# Patient Record
Sex: Male | Born: 1980 | Race: White | Hispanic: No | Marital: Married | Smoking: Never smoker
Health system: Southern US, Community
[De-identification: ages and names within clinical notes are randomized; demographics above are authoritative.]

## PROBLEM LIST (undated history)

## (undated) DIAGNOSIS — I10 Essential (primary) hypertension: Secondary | ICD-10-CM

## (undated) HISTORY — DX: Essential (primary) hypertension: I10

## (undated) HISTORY — PX: CHOLECYSTECTOMY: SHX55

---

## 1999-07-22 ENCOUNTER — Emergency Department (HOSPITAL_COMMUNITY): Admission: EM | Admit: 1999-07-22 | Discharge: 1999-07-22 | Payer: Self-pay | Admitting: Emergency Medicine

## 1999-12-09 ENCOUNTER — Emergency Department (HOSPITAL_COMMUNITY): Admission: EM | Admit: 1999-12-09 | Discharge: 1999-12-09 | Payer: Self-pay | Admitting: Emergency Medicine

## 1999-12-09 ENCOUNTER — Encounter: Payer: Self-pay | Admitting: Emergency Medicine

## 2006-03-05 ENCOUNTER — Emergency Department (HOSPITAL_COMMUNITY): Admission: EM | Admit: 2006-03-05 | Discharge: 2006-03-06 | Payer: Self-pay | Admitting: Emergency Medicine

## 2006-03-07 ENCOUNTER — Ambulatory Visit (HOSPITAL_COMMUNITY): Admission: RE | Admit: 2006-03-07 | Discharge: 2006-03-07 | Payer: Self-pay | Admitting: Family Medicine

## 2007-12-16 ENCOUNTER — Emergency Department (HOSPITAL_COMMUNITY): Admission: EM | Admit: 2007-12-16 | Discharge: 2007-12-16 | Payer: Self-pay | Admitting: Emergency Medicine

## 2010-02-11 ENCOUNTER — Inpatient Hospital Stay (HOSPITAL_COMMUNITY): Admission: EM | Admit: 2010-02-11 | Discharge: 2010-02-14 | Payer: Self-pay | Admitting: Emergency Medicine

## 2010-02-13 ENCOUNTER — Encounter (INDEPENDENT_AMBULATORY_CARE_PROVIDER_SITE_OTHER): Payer: Self-pay | Admitting: General Surgery

## 2010-05-22 ENCOUNTER — Encounter: Payer: Self-pay | Admitting: Orthopedic Surgery

## 2010-07-12 LAB — HEPATIC FUNCTION PANEL
ALT: 14 U/L (ref 0–53)
ALT: 34 U/L (ref 0–53)
AST: 33 U/L (ref 0–37)
Alkaline Phosphatase: 59 U/L (ref 39–117)
Bilirubin, Direct: 0.2 mg/dL (ref 0.0–0.3)
Indirect Bilirubin: 0.3 mg/dL (ref 0.3–0.9)
Indirect Bilirubin: 0.6 mg/dL (ref 0.3–0.9)
Total Bilirubin: 0.8 mg/dL (ref 0.3–1.2)
Total Protein: 6.5 g/dL (ref 6.0–8.3)

## 2010-07-12 LAB — CBC
Hemoglobin: 15.4 g/dL (ref 13.0–17.0)
MCHC: 34.3 g/dL (ref 30.0–36.0)
Platelets: 200 10*3/uL (ref 150–400)
RBC: 5.18 MIL/uL (ref 4.22–5.81)
RDW: 12.4 % (ref 11.5–15.5)
WBC: 10.5 10*3/uL (ref 4.0–10.5)
WBC: 8.2 10*3/uL (ref 4.0–10.5)

## 2010-07-12 LAB — BASIC METABOLIC PANEL
BUN: 4 mg/dL — ABNORMAL LOW (ref 6–23)
Creatinine, Ser: 0.94 mg/dL (ref 0.4–1.5)
GFR calc non Af Amer: >60 mL/min (ref >60)

## 2010-07-12 LAB — COMPREHENSIVE METABOLIC PANEL
ALT: 17 U/L (ref 0–53)
AST: 20 U/L (ref 0–37)
CO2: 22 mEq/L (ref 19–32)
Calcium: 9.5 mg/dL (ref 8.4–10.5)
GFR calc Af Amer: >60 mL/min (ref >60)
GFR calc non Af Amer: >60 mL/min (ref >60)
Sodium: 134 mEq/L — ABNORMAL LOW (ref 135–145)

## 2010-07-12 LAB — DIFFERENTIAL
Basophils Absolute: 0 10*3/uL (ref 0.0–0.1)
Basophils Relative: 0 % (ref 0–1)
Eosinophils Absolute: 0 10*3/uL (ref 0.0–0.7)
Eosinophils Relative: 0 % (ref 0–5)
Eosinophils Relative: 1 % (ref 0–5)
Lymphocytes Relative: 16 % (ref 12–46)
Lymphs Abs: 1.3 10*3/uL (ref 0.7–4.0)
Monocytes Relative: 5 % (ref 3–12)
Neutro Abs: 6.1 10*3/uL (ref 1.7–7.7)

## 2010-07-12 LAB — LIPASE, BLOOD: Lipase: 26 U/L (ref 11–59)

## 2012-05-30 ENCOUNTER — Emergency Department (HOSPITAL_COMMUNITY)
Admission: EM | Admit: 2012-05-30 | Discharge: 2012-05-30 | Disposition: A | Discharge status: Home / Self Care | Payer: BC Managed Care – PPO | Attending: Emergency Medicine | Admitting: Emergency Medicine

## 2012-05-30 ENCOUNTER — Encounter (HOSPITAL_COMMUNITY): Payer: Self-pay | Admitting: *Deleted

## 2012-05-30 DIAGNOSIS — R509 Fever, unspecified: Secondary | ICD-10-CM | POA: Insufficient documentation

## 2012-05-30 DIAGNOSIS — R51 Headache: Secondary | ICD-10-CM | POA: Insufficient documentation

## 2012-05-30 DIAGNOSIS — J111 Influenza due to unidentified influenza virus with other respiratory manifestations: Secondary | ICD-10-CM

## 2012-05-30 DIAGNOSIS — IMO0001 Reserved for inherently not codable concepts without codable children: Secondary | ICD-10-CM | POA: Insufficient documentation

## 2012-05-30 DIAGNOSIS — R5381 Other malaise: Secondary | ICD-10-CM | POA: Insufficient documentation

## 2012-05-30 DIAGNOSIS — R059 Cough, unspecified: Secondary | ICD-10-CM | POA: Insufficient documentation

## 2012-05-30 DIAGNOSIS — R05 Cough: Secondary | ICD-10-CM | POA: Insufficient documentation

## 2012-05-30 DIAGNOSIS — J3489 Other specified disorders of nose and nasal sinuses: Secondary | ICD-10-CM | POA: Insufficient documentation

## 2012-05-30 MED ORDER — IBUPROFEN 800 MG PO TABS
800.0000 mg | ORAL_TABLET | Freq: Once | ORAL | Status: AC
  Administered 2012-05-30: 800 mg via ORAL
  Filled 2012-05-30: qty 1

## 2012-05-30 MED ORDER — HYDROCOD POLST-CHLORPHEN POLST 10-8 MG/5ML PO LQCR
5.0000 mL | Freq: Once | ORAL | Status: AC
  Administered 2012-05-30: 5 mL via ORAL
  Filled 2012-05-30: qty 5

## 2012-05-30 MED ORDER — HYDROCOD POLST-CHLORPHEN POLST 10-8 MG/5ML PO LQCR: 5.0000 mL | Freq: Two times a day (BID) | ORAL | Status: DC | PRN

## 2012-05-30 NOTE — ED Notes (Signed)
Fever, chill, nausea, cough, body aches. Runny nose. Sl sore throat.

## 2012-05-30 NOTE — ED Provider Notes (Signed)
History     CSN: [id number]  Arrival date & time 05/30/12  2144   First MD Initiated Contact with Patient 05/30/12 2156      Chief Complaint  Patient presents with  . Fever    (Consider location/radiation/quality/duration/timing/severity/associated sxs/prior treatment) Patient is a 32 y.o. male presenting with flu symptoms.  Influenza This is a new problem. The current episode started yesterday. The problem occurs constantly. The problem has been gradually worsening. Associated symptoms include chills, coughing, fatigue, a fever, headaches and myalgias. Pertinent negatives include no abdominal pain, arthralgias, change in bowel habit, chest pain, neck pain or rash. Nothing aggravates the symptoms. He has tried acetaminophen for the symptoms. The treatment provided no relief.    History reviewed. No pertinent past medical history.  Past Surgical History  Procedure Date  . Cholecystectomy     History reviewed. No pertinent family history.  History  Substance Use Topics  . Smoking status: Never Smoker   . Smokeless tobacco: Not on file  . Alcohol Use: Yes      Review of Systems  Constitutional: Positive for fever, chills and fatigue. Negative for activity change.       All ROS Neg except as noted in HPI  HENT: Negative for nosebleeds and neck pain.   Eyes: Negative for photophobia and discharge.  Respiratory: Positive for cough. Negative for shortness of breath and wheezing.   Cardiovascular: Negative for chest pain and palpitations.  Gastrointestinal: Negative for abdominal pain, blood in stool and change in bowel habit.  Genitourinary: Negative for dysuria, frequency and hematuria.  Musculoskeletal: Positive for myalgias. Negative for back pain and arthralgias.  Skin: Negative.  Negative for rash.  Neurological: Positive for headaches. Negative for dizziness, seizures and speech difficulty.  Psychiatric/Behavioral: Negative for hallucinations and confusion.     Allergies  Review of patient's allergies indicates no known allergies.  Home Medications  No current outpatient prescriptions on file.  BP 163/103  Pulse 109  Temp 100.5 F (38.1 C) (Oral)  Resp 20  Ht 6' (1.829 m)  Wt 287 lb (130.182 kg)  BMI 38.92 kg/m2  SpO2 98%  Physical Exam  Nursing note and vitals reviewed. Constitutional: He is oriented to person, place, and time. He appears well-developed and well-nourished.  Non-toxic appearance.  HENT:  Head: Normocephalic.  Right Ear: Tympanic membrane and external ear normal.  Left Ear: Tympanic membrane and external ear normal.       Nasal congestion.  Eyes: EOM and lids are normal. Pupils are equal, round, and reactive to light.  Neck: Normal range of motion. Neck supple. Carotid bruit is not present.  Cardiovascular: Normal rate, regular rhythm, normal heart sounds, intact distal pulses and normal pulses.   Pulmonary/Chest: Breath sounds normal. No respiratory distress.  Abdominal: Soft. Bowel sounds are normal. There is no tenderness. There is no guarding.  Musculoskeletal: Normal range of motion.  Lymphadenopathy:       Head (right side): No submandibular adenopathy present.       Head (left side): No submandibular adenopathy present.    He has no cervical adenopathy.  Neurological: He is alert and oriented to person, place, and time. He has normal strength. No cranial nerve deficit or sensory deficit. He exhibits normal muscle tone. Coordination normal.  Skin: Skin is warm and dry. No rash noted.  Psychiatric: He has a normal mood and affect. His speech is normal.    ED Course  Procedures (including critical care time)   Labs Reviewed  RAPID STREP SCREEN   No results found.   No diagnosis found.    MDM  I have reviewed nursing notes, vital signs, and all appropriate lab and imaging results for this patient. Examination and history are consistent with influenza. Patient is advised to use Tylenol every 4  hours or ibuprofen every 6 hours for fever and aching. He is given prescription for Tussionex every 12 hours for cough and congestion. He's been further advised to increase fluids, wash hands frequently, use mask until symptoms have resolved. Patient invited to return to the emergency department if any changes, problems, or concerns.       Kathie Dike, [place] 05/30/12 2308

## 2012-06-01 NOTE — ED Provider Notes (Signed)
Medical screening examination/treatment/procedure(s) were performed by non-physician practitioner and as supervising physician I was immediately available for consultation/collaboration.   Carleene Cooper III, MD 06/01/12 [phone]

## 2014-02-18 IMAGING — CR DG ANKLE 2V *L*
1 series · 1 of 1 positions shown · non-contrast
Comparison: None.

CLINICAL DATA: Injury at work 07/01/2012, ankle pain

LEFT ANKLE - 2 VIEW

[AP]
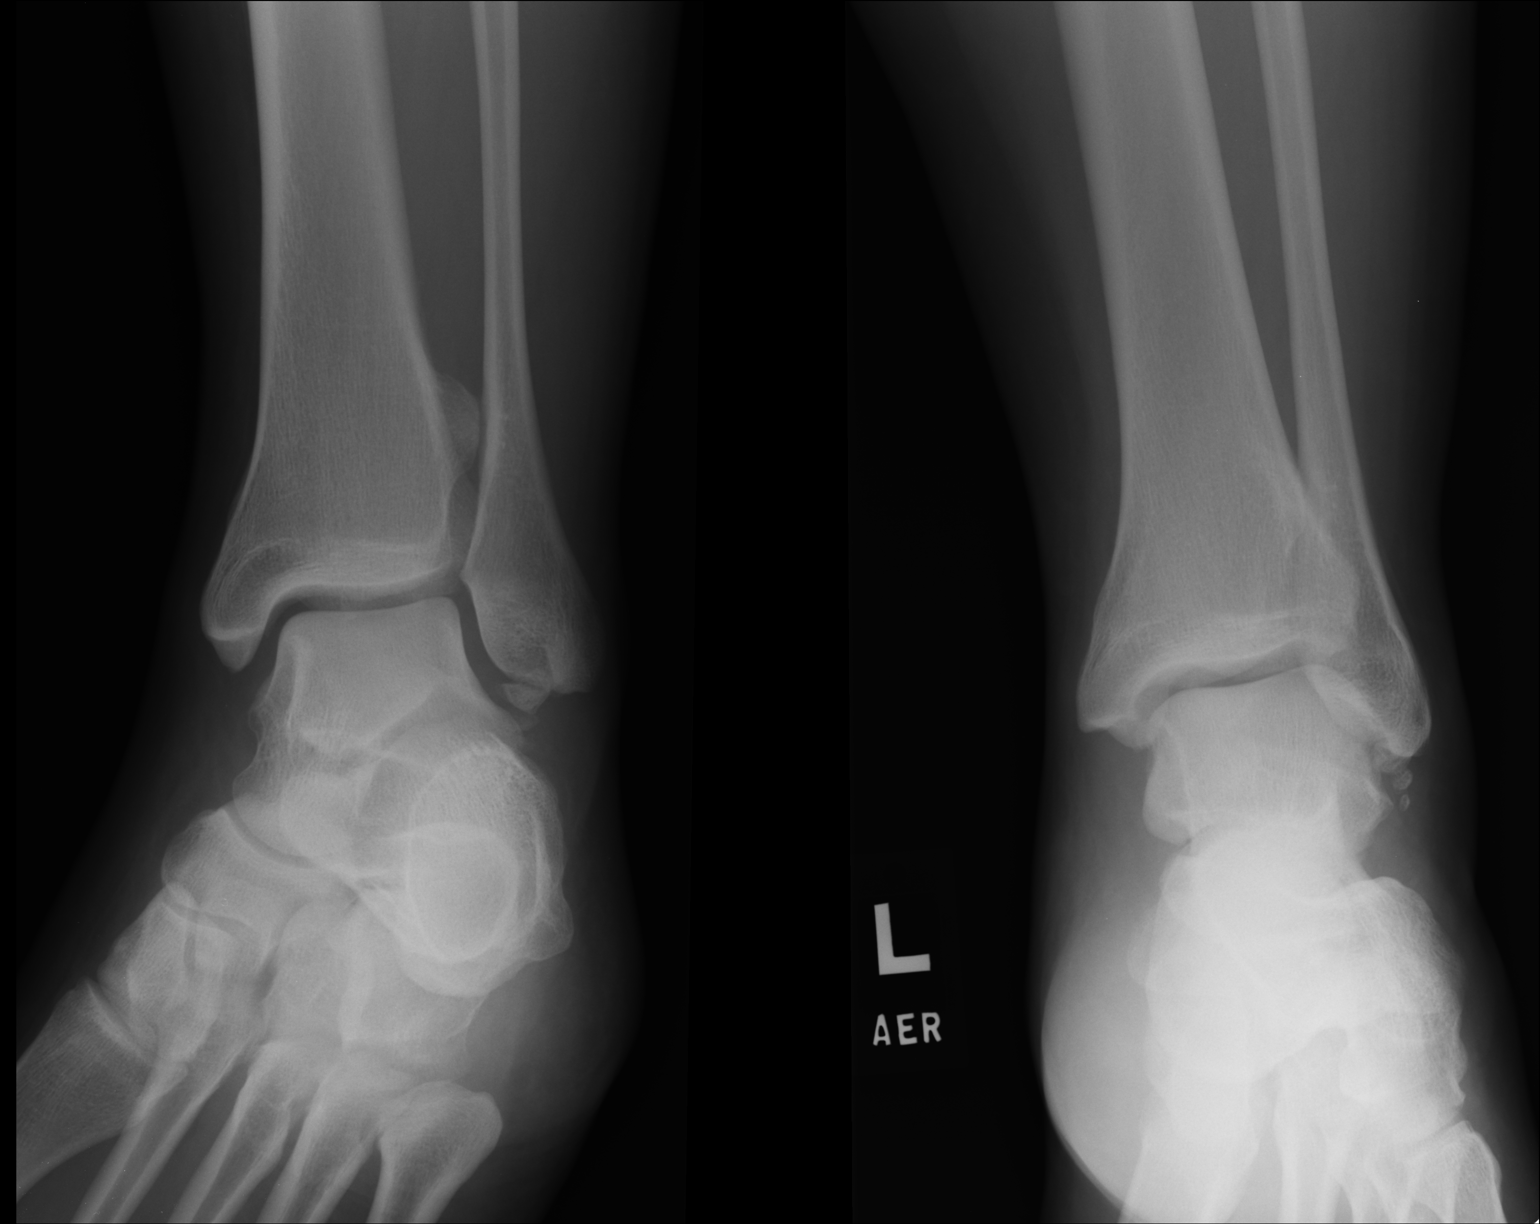

[1 of 1 positions shown; findings below may reference images not displayed]

FINDINGS: Two views of the left ankle submitted.  No acute fracture
or subluxation.  Ankle mortise is preserved.  Well corticated bony
fragment adjacent to tip of distal fibula is most likely from prior
injury.
IMPRESSION: No acute fracture or subluxation.  Ankle mortise is preserved.

## 2015-07-17 ENCOUNTER — Encounter (HOSPITAL_COMMUNITY): Payer: Self-pay

## 2015-07-17 ENCOUNTER — Emergency Department (HOSPITAL_COMMUNITY)
Admission: EM | Admit: 2015-07-17 | Discharge: 2015-07-17 | Disposition: A | Discharge status: Home / Self Care | Payer: Managed Care, Other (non HMO) | Attending: Emergency Medicine | Admitting: Emergency Medicine

## 2015-07-17 DIAGNOSIS — J111 Influenza due to unidentified influenza virus with other respiratory manifestations: Secondary | ICD-10-CM | POA: Insufficient documentation

## 2015-07-17 DIAGNOSIS — R69 Illness, unspecified: Secondary | ICD-10-CM

## 2015-07-17 DIAGNOSIS — R05 Cough: Secondary | ICD-10-CM | POA: Diagnosis present

## 2015-07-17 MED ORDER — BENZONATATE 100 MG PO CAPS: 100.0000 mg | ORAL_CAPSULE | Freq: Three times a day (TID) | ORAL | Status: DC

## 2015-07-17 MED ORDER — METHYLPREDNISOLONE 4 MG PO TBPK: ORAL_TABLET | ORAL | Status: DC

## 2015-07-17 MED ORDER — IBUPROFEN 800 MG PO TABS: 800.0000 mg | ORAL_TABLET | Freq: Three times a day (TID) | ORAL | Status: DC

## 2015-07-17 NOTE — Discharge Instructions (Signed)

## 2015-07-17 NOTE — ED Provider Notes (Signed)
CSN: [id number]     Arrival date & time 07/17/15  1008 History  By signing my name below, I, Ronney LionSuzanne Le, attest that this documentation has been prepared under the direction and in the presence of Eber HongBrian Brystal Kildow, MD. Electronically Signed: Ronney LionSuzanne Le, ED Scribe. 07/17/2015. 10:31 AM.    Chief Complaint  Patient presents with  . Cough   The history is provided by the patient. No language interpreter was used.    HPI Comments: Benjamin Farley is a 35 y.o. male who presents to the Emergency Department with multiple URI-type complaints, including cough and fever that began 2 days ago, and acutely worsened yesterday. He notes associated congestion, chills, generalized myalgias, rhinorrhea, and generalized headache secondary to his cough. Patient has not tried any treatments or medications for his symptoms. Patient states he is otherwise healthy, with no history of chronic medical conditions. He denies sore throat, abdominal pain, or leg swelling. He denies a history of smoking.    History reviewed. No pertinent past medical history. Past Surgical History  Procedure Laterality Date  . Cholecystectomy     No family history on file. Social History  Substance Use Topics  . Smoking status: Never Smoker   . Smokeless tobacco: None  . Alcohol Use: Yes     Comment: occ    Review of Systems  Constitutional: Positive for fever and chills.  HENT: Positive for congestion and rhinorrhea. Negative for sore throat.   Respiratory: Positive for cough.   Cardiovascular: Negative for leg swelling.  Gastrointestinal: Negative for abdominal pain.  Musculoskeletal: Positive for myalgias (generalized).  Neurological: Positive for headaches.  All other systems reviewed and are negative.  Allergies  Review of patient's allergies indicates no known allergies.  Home Medications   Prior to Admission medications   Medication Sig Start Date End Date Taking? Authorizing Provider  benzonatate (TESSALON) 100 MG  capsule Take 1 capsule (100 mg total) by mouth every 8 (eight) hours. 07/17/15   Eber HongBrian Rithvik Orcutt, MD  chlorpheniramine-HYDROcodone [facility](TUSSIONEX PENNKINETIC ER) 10-8 MG/5ML LQCR Take 5 mLs by mouth every 12 (twelve) hours as needed. 05/30/12   Ivery QualeHobson Bryant, PA-C  ibuprofen (ADVIL,MOTRIN) 800 MG tablet Take 1 tablet (800 mg total) by mouth 3 (three) times daily. 07/17/15   Eber HongBrian Baily Hovanec, MD  methylPREDNISolone (MEDROL DOSEPAK) 4 MG TBPK tablet Taper 07/17/15   Eber HongBrian Roselle Norton, MD   BP 149/100 mmHg  Pulse 106  Temp(Src) 100 F (37.8 C) (Oral)  Resp 18  Ht 6' (1.829 m)  Wt 300 lb (136.079 kg)  BMI 40.68 kg/m2  SpO2 99% Physical Exam  Constitutional: He appears well-developed and well-nourished.  HENT:  Head: Normocephalic and atraumatic.  Eyes: Conjunctivae are normal. Right eye exhibits no discharge. Left eye exhibits no discharge.  Pulmonary/Chest: Effort normal. No respiratory distress.  Musculoskeletal: He exhibits no edema.  Neurological: He is alert. Coordination normal.  Skin: Skin is warm and dry. No rash noted. He is not diaphoretic. No erythema.  Psychiatric: He has a normal mood and affect.  Nursing note and vitals reviewed.   ED Course  Procedures (including critical care time)  DIAGNOSTIC STUDIES: Oxygen Saturation is 99% on RA, normal by my interpretation.    COORDINATION OF CARE: 10:23 AM - Suspect influenza. Discussed treatment plan with pt at bedside which includes steroid, high-dose anti-inflammatory. Discussed with pt that he is contagious. Will give work note. Pt verbalized understanding and agreed to plan.   MDM   Final diagnoses:  Influenza-like illness  Influenza Like Illness - wel appearing otherwise,  Agreeable to supportive care Meds given in ED:  Medications - No data to display  Discharge Medication List as of 07/17/2015 10:31 AM    START taking these medications   Details  benzonatate (TESSALON) 100 MG capsule Take 1 capsule (100 mg total) by mouth  every 8 (eight) hours., Starting 07/17/2015, Until Discontinued, Print    ibuprofen (ADVIL,MOTRIN) 800 MG tablet Take 1 tablet (800 mg total) by mouth 3 (three) times daily., Starting 07/17/2015, Until Discontinued, Print    methylPREDNISolone (MEDROL DOSEPAK) 4 MG TBPK tablet Taper, Print            Eber Hong, MD 07/18/15 [phone]

## 2015-07-17 NOTE — ED Notes (Signed)
Pt c/o cough and fever x 2 days

## 2016-09-04 ENCOUNTER — Encounter: Payer: Self-pay | Admitting: Cardiology

## 2016-09-06 ENCOUNTER — Encounter: Payer: Self-pay | Admitting: Neurology

## 2016-10-01 ENCOUNTER — Ambulatory Visit: Payer: Managed Care, Other (non HMO) | Admitting: Neurology

## 2016-10-08 ENCOUNTER — Ambulatory Visit (INDEPENDENT_AMBULATORY_CARE_PROVIDER_SITE_OTHER): Payer: Managed Care, Other (non HMO) | Admitting: Cardiology

## 2016-10-08 ENCOUNTER — Encounter: Payer: Self-pay | Admitting: Cardiology

## 2016-10-08 VITALS — BP 132/84 | HR 81 | Ht 72.0 in | Wt 315.0 lb

## 2016-10-08 DIAGNOSIS — R079 Chest pain, unspecified: Secondary | ICD-10-CM

## 2016-10-08 DIAGNOSIS — R002 Palpitations: Secondary | ICD-10-CM | POA: Diagnosis not present

## 2016-10-08 NOTE — Progress Notes (Signed)
       Clinical Summary [name] is a 36 y.o.male  seen as new patient, referred by Dr Benjamin Farley for palpitations.   1. Palpitations - feeling like an anxiety/adrenaline rush. Throat tightens up, heart will start racing. Lasts just a few seconds. No specifici pattern or trigger. Sporadic in frequency, last episode about 1 month ago. - no coffee, iced tea daily x 2 glasses, weaning sodas, no energy drinks. Drinks beer x 2 days a week x 6 per day - diffuse chest tightness at times, nonexertional, 3-4/10 in severity. No other associated symptoms. Lasts a few minutes. Occurs sporadically    Past Medical History:  Diagnosis Date  . HTN (hypertension)      No Known Allergies   Current Outpatient Prescriptions  Medication Sig Dispense Refill  . hydrochlorothiazide (MICROZIDE) 12.5 MG capsule      No current facility-administered medications for this visit.      Past Surgical History:  Procedure Laterality Date  . CHOLECYSTECTOMY       No Known Allergies    Family History  Problem Relation Age of Onset  . COPD Father   . Hypertension Father   . CVA Maternal Grandmother   . Hypertension Maternal Grandmother   . COPD Maternal Grandfather   . COPD Paternal Grandmother   . Heart attack Paternal Grandfather   . Hypertension Paternal Grandfather      Social History [name] reports that he has never smoked. He has never used smokeless tobacco. [name] reports that he drinks alcohol.   Review of Systems CONSTITUTIONAL: No weight loss, fever, chills, weakness or fatigue.  HEENT: Eyes: No visual loss, blurred vision, double vision or yellow sclerae.No hearing loss, sneezing, congestion, runny nose or sore throat.  SKIN: No rash or itching.  CARDIOVASCULAR: per Hpi RESPIRATORY: No shortness of breath, cough or sputum.  GASTROINTESTINAL: No anorexia, nausea, vomiting or diarrhea. No abdominal pain or blood.  GENITOURINARY: No burning on urination, no  polyuria NEUROLOGICAL: No headache, dizziness, syncope, paralysis, ataxia, numbness or tingling in the extremities. No change in bowel or bladder control.  MUSCULOSKELETAL: No muscle, back pain, joint pain or stiffness.  LYMPHATICS: No enlarged nodes. No history of splenectomy.  PSYCHIATRIC: No history of depression or anxiety.  ENDOCRINOLOGIC: No reports of sweating, cold or heat intolerance. No polyuria or polydipsia.  Marland Kitchen.   Physical Examination Vitals:   10/08/16 0839 10/08/16 0846  BP: 126/86 132/84  Pulse: 81    Filed Weights   10/08/16 0839  Weight: (!) 315 lb (142.9 kg)    Gen: resting comfortably, no acute distress HEENT: no scleral icterus, pupils equal round and reactive, no palptable cervical adenopathy,  CV: RRR, no m/r/g, no jvd Resp: Clear to auscultation bilaterally GI: abdomen is soft, non-tender, non-distended, normal bowel sounds, no hepatosplenomegaly MSK: extremities are warm, no edema.  Skin: warm, no rash Neuro:  no focal deficits Psych: appropriate affect      Assessment and Plan  1. Palpitations - EKG in clinic shows NSR - counseled to wean caffeine and EtOH - we will obtain 2 week event monitor to evaluate for symptomatic arrhythmia - request labs from pcp, if does not include TSH,K,Mg will need to order next visit      Benjamin Farley, M.D.

## 2016-10-08 NOTE — Patient Instructions (Signed)
Medication Instructions:  Your physician recommends that you continue on your current medications as directed. Please refer to the Current Medication list given to you today.   Labwork: I will request a copy of labs from pcp  Testing/Procedures: Your physician has recommended that you wear an event monitor. Event monitors are medical devices that record the heart's electrical activity. Doctors most often us these monitors to diagnose arrhythmias. Arrhythmias are problems with the speed or rhythm of the heartbeat. The monitor is a small, portable device. You can wear one while you do your normal daily activities. This is usually used to diagnose what is causing palpitations/syncope (passing out).    Follow-Up: Your physician recommends that you schedule a follow-up appointment in: 1 month    Any Other Special Instructions Will Be Listed Below (If Applicable).     If you need a refill on your cardiac medications before your next appointment, please call your pharmacy.

## 2016-10-15 ENCOUNTER — Encounter (INDEPENDENT_AMBULATORY_CARE_PROVIDER_SITE_OTHER): Payer: Managed Care, Other (non HMO)

## 2016-10-15 DIAGNOSIS — R002 Palpitations: Secondary | ICD-10-CM

## 2016-11-07 ENCOUNTER — Telehealth: Payer: Self-pay | Admitting: *Deleted

## 2016-11-07 NOTE — Telephone Encounter (Signed)
Called patient with test results. No answer. Left message to call back.  

## 2016-11-07 NOTE — Telephone Encounter (Signed)
-----   Message from Antoine PocheJonathan F Branch, MD sent at 11/07/2016 10:56 AM EDT ----- Heart monitor looks ok. Did he have trouble wearing it, looks like for a large amount of time it was not able to collect data. How are his palpitations doing?   J BrancH MD

## 2016-11-26 ENCOUNTER — Encounter: Payer: Self-pay | Admitting: Cardiology

## 2016-11-26 ENCOUNTER — Ambulatory Visit: Payer: Managed Care, Other (non HMO) | Admitting: Cardiology

## 2016-11-26 NOTE — Progress Notes (Deleted)
     Clinical Summary [name] is a 36 y.o.male  1. Palpitations - feeling like an anxiety/adrenaline rush. Throat tightens up, heart will start racing. Lasts just a few seconds. No specifici pattern or trigger. Sporadic in frequency, last episode about 1 month ago. - no coffee, iced tea daily x 2 glasses, weaning sodas, no energy drinks. Drinks beer x 2 days a week x 6 per day - diffuse chest tightness at times, nonexertional, 3-4/10 in severity. No other associated symptoms. Lasts a few minutes. Occurs sporadically  08/2016 [facility] labs: K 4.5, TSH 3 Past Medical History:  Diagnosis Date  . HTN (hypertension)      No Known Allergies   Current Outpatient Prescriptions  Medication Sig Dispense Refill  . hydrochlorothiazide (MICROZIDE) 12.5 MG capsule      No current facility-administered medications for this visit.      Past Surgical History:  Procedure Laterality Date  . CHOLECYSTECTOMY       No Known Allergies    Family History  Problem Relation Age of Onset  . COPD Father   . Hypertension Father   . CVA Maternal Grandmother   . Hypertension Maternal Grandmother   . COPD Maternal Grandfather   . COPD Paternal Grandmother   . Heart attack Paternal Grandfather   . Hypertension Paternal Grandfather      Social History [name] reports that he has never smoked. He has never used smokeless tobacco. [name] reports that he drinks alcohol.   Review of Systems CONSTITUTIONAL: No weight loss, fever, chills, weakness or fatigue.  HEENT: Eyes: No visual loss, blurred vision, double vision or yellow sclerae.No hearing loss, sneezing, congestion, runny nose or sore throat.  SKIN: No rash or itching.  CARDIOVASCULAR:  RESPIRATORY: No shortness of breath, cough or sputum.  GASTROINTESTINAL: No anorexia, nausea, vomiting or diarrhea. No abdominal pain or blood.  GENITOURINARY: No burning on urination, no polyuria NEUROLOGICAL: No headache, dizziness, syncope,  paralysis, ataxia, numbness or tingling in the extremities. No change in bowel or bladder control.  MUSCULOSKELETAL: No muscle, back pain, joint pain or stiffness.  LYMPHATICS: No enlarged nodes. No history of splenectomy.  PSYCHIATRIC: No history of depression or anxiety.  ENDOCRINOLOGIC: No reports of sweating, cold or heat intolerance. No polyuria or polydipsia.  Marland Kitchen.   Physical Examination There were no vitals filed for this visit. There were no vitals filed for this visit.  Gen: resting comfortably, no acute distress HEENT: no scleral icterus, pupils equal round and reactive, no palptable cervical adenopathy,  CV Resp: Clear to auscultation bilaterally GI: abdomen is soft, non-tender, non-distended, normal bowel sounds, no hepatosplenomegaly MSK: extremities are warm, no edema.  Skin: warm, no rash Neuro:  no focal deficits Psych: appropriate affect   Diagnostic Studies  09/2016 2 week event monitor  Data only available for 16% of planned monitored time  Min HR 55, Max HR 148, Avg HR 80  No symptoms reported  Telemetry tracings show sinus rhythm  No significant arrhythmias   Assessment and Plan   1. Palpitations - EKG in clinic shows NSR - counseled to wean caffeine and EtOH - we will obtain 2 week event monitor to evaluate for symptomatic arrhythmia - request labs from pcp, if does not include TSH,K,Mg will need to order next visit      Antoine PocheJonathan F. Simisola Sandles, M.D., F.A.C.C.

## 2019-10-23 ENCOUNTER — Other Ambulatory Visit: Payer: Self-pay

## 2019-10-23 ENCOUNTER — Encounter (HOSPITAL_COMMUNITY): Payer: Self-pay | Admitting: *Deleted

## 2019-10-23 ENCOUNTER — Emergency Department (HOSPITAL_COMMUNITY)
Admission: EM | Admit: 2019-10-23 | Discharge: 2019-10-23 | Disposition: A | Discharge status: Home / Self Care | Payer: BC Managed Care – PPO | Attending: Emergency Medicine | Admitting: Emergency Medicine

## 2019-10-23 DIAGNOSIS — M545 Low back pain, unspecified: Secondary | ICD-10-CM

## 2019-10-23 DIAGNOSIS — I10 Essential (primary) hypertension: Secondary | ICD-10-CM | POA: Diagnosis not present

## 2019-10-23 DIAGNOSIS — G8929 Other chronic pain: Secondary | ICD-10-CM | POA: Diagnosis not present

## 2019-10-23 MED ORDER — LORAZEPAM 2 MG/ML IJ SOLN
2.0000 mg | Freq: Once | INTRAMUSCULAR | Status: AC
  Administered 2019-10-23: 2 mg via INTRAMUSCULAR
  Filled 2019-10-23: qty 1

## 2019-10-23 MED ORDER — PREDNISONE 50 MG PO TABS
60.0000 mg | ORAL_TABLET | Freq: Once | ORAL | Status: AC
  Administered 2019-10-23: 60 mg via ORAL
  Filled 2019-10-23: qty 1

## 2019-10-23 MED ORDER — HYDROMORPHONE HCL 2 MG/ML IJ SOLN
2.0000 mg | Freq: Once | INTRAMUSCULAR | Status: AC
  Administered 2019-10-23: 2 mg via INTRAMUSCULAR
  Filled 2019-10-23: qty 1

## 2019-10-23 MED ORDER — HYDROCODONE-ACETAMINOPHEN 5-325 MG PO TABS: 1.0000 | ORAL_TABLET | ORAL | 0 refills | Status: DC | PRN

## 2019-10-23 MED ORDER — DIAZEPAM 10 MG PO TABS: 10.0000 mg | ORAL_TABLET | Freq: Three times a day (TID) | ORAL | 0 refills | Status: DC | PRN

## 2019-10-23 MED ORDER — PREDNISONE 20 MG PO TABS: 20.0000 mg | ORAL_TABLET | Freq: Two times a day (BID) | ORAL | 0 refills | Status: DC

## 2019-10-23 NOTE — ED Triage Notes (Signed)
Patient presents to the ED with worsening lower left back pain for one day.  Patient reports this is chronic due to history of scoliosis.  Patient is seen by a chiropractor.

## 2019-10-23 NOTE — ED Provider Notes (Signed)
[facility] EMERGENCY DEPARTMENT Provider Note   CSN: [id number] Arrival date & time: 10/23/19  6834     History Chief Complaint  Patient presents with  . Back Pain    Benjamin Farley is a 39 y.o. male.  HPI He presents for evaluation of worsening chronic back pain.  He sees a Land occasionally for it.  He went this morning and they deferred treatment because he was in too much pain for a manipulative procedure.  Patient has not taken anything for the pain.  He denies bowel or bladder dysfunction.  He denies fever, chills, weakness or dizziness.  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  There are no other known modifying factors.    Past Medical History:  Diagnosis Date  . HTN (hypertension)     There are no problems to display for this patient.   Past Surgical History:  Procedure Laterality Date  . CHOLECYSTECTOMY         Family History  Problem Relation Age of Onset  . COPD Father   . Hypertension Father   . CVA Maternal Grandmother   . Hypertension Maternal Grandmother   . COPD Maternal Grandfather   . COPD Paternal Grandmother   . Heart attack Paternal Grandfather   . Hypertension Paternal Grandfather     Social History   Tobacco Use  . Smoking status: Never Smoker  . Smokeless tobacco: Never Used  Vaping Use  . Vaping Use: Never used  Substance Use Topics  . Alcohol use: Yes    Comment: occ  . Drug use: No    Home Medications Prior to Admission medications   Medication Sig Start Date End Date Taking? Authorizing Provider  hydrochlorothiazide (MICROZIDE) 12.5 MG capsule  08/28/16   [provider]    Allergies    Patient has no known allergies.  Review of Systems   Review of Systems  All other systems reviewed and are negative.   Physical Exam Updated Vital Signs BP (!) 165/100 (BP Location: Right Arm)   Pulse 82   Temp 97.9 F (36.6 C) (Oral)   Resp 14   Ht 6\' 1"  (1.854 m)   Wt (!) 140.6 kg   SpO2 100%    BMI 40.90 kg/m   Physical Exam Vitals and nursing note reviewed.  Constitutional:      General: He is in acute distress (Uncomfortable).     Appearance: He is well-developed. He is obese. He is not ill-appearing, toxic-appearing or diaphoretic.  HENT:     Head: Normocephalic and atraumatic.     Right Ear: External ear normal.     Left Ear: External ear normal.  Eyes:     Conjunctiva/sclera: Conjunctivae normal.     Pupils: Pupils are equal, round, and reactive to light.  Neck:     Trachea: Phonation normal.  Cardiovascular:     Rate and Rhythm: Normal rate.  Pulmonary:     Effort: Pulmonary effort is normal.  Abdominal:     General: There is no distension.  Musculoskeletal:     Cervical back: Normal range of motion and neck supple.     Comments: He moves slowly secondary to lower back pain.  He is seated on a stretcher, and can move the legs but this aggravates his lower back pain.  The back is nontender to palpation.  There is no visible spinal deformity.  Skin:    General: Skin is warm and dry.  Neurological:  Mental Status: He is alert and oriented to person, place, and time.     Cranial Nerves: No cranial nerve deficit.     Sensory: No sensory deficit.     Motor: No abnormal muscle tone.     Coordination: Coordination normal.  Psychiatric:        Mood and Affect: Mood normal.        Behavior: Behavior normal.        Thought Content: Thought content normal.        Judgment: Judgment normal.     ED Results / Procedures / Treatments   Labs (all labs ordered are listed, but only abnormal results are displayed) Labs Reviewed - No data to display  EKG None  Radiology No results found.  Procedures Procedures (including critical care time)  Medications Ordered in ED Medications - No data to display  ED Course  I have reviewed the triage vital signs and the nursing notes.  Pertinent labs & imaging results that were available during my care of the patient  were reviewed by me and considered in my medical decision making (see chart for details).    MDM Rules/Calculators/A&P                           Patient Vitals for the past 24 hrs:  BP Temp Temp src Pulse Resp SpO2 Height Weight  10/23/19 0841 -- -- -- -- -- -- 6\' 1"  (1.854 m) (!) 140.6 kg  10/23/19 0838 (!) 165/100 97.9 F (36.6 C) Oral 82 14 100 % -- --    10:57 AM Reevaluation with update and discussion. After initial assessment and treatment, an updated evaluation reveals he states that his pain is better and he feels ready to go home.  Wife is now here with him and she will take him.  Findings discussed and questions answered. 10/25/19   Medical Decision Making:  This patient is presenting for evaluation of atraumatic lower back pain, worsening over the last 24 hours, which does require a range of treatment options, and is a complaint that involves a moderate risk of morbidity and mortality. The differential diagnoses include muscle strain, fracture, radiculopathy. I decided to review old records, and in summary obese male, works as a [employer], has not sustained recent trauma and has worsening of chronic pain, described previously by a chiropractor as related to scoliosis.  I did not require additional historical information from anyone.    Critical Interventions-clinical evaluation, medication treatment, observation and reassessment  After These Interventions, the Patient was reevaluated and was found stable for discharge.  Patient with history of scoliosis presenting with back pain, which is worsening.  No evidence for spinal myelopathy, based on clinical exam.  He is stable for discharge.  CRITICAL CARE-no Performed by: Copywriter, advertising  Nursing Notes Reviewed/ Care Coordinated Applicable Imaging Reviewed Interpretation of Laboratory Data incorporated into ED treatment  The patient appears reasonably screened and/or stabilized for discharge and I doubt any other medical  condition or other [facility] requiring further screening, evaluation, or treatment in the ED at this time prior to discharge.  Plan: Home Medications-OTC as needed; Home Treatments-rest, heat to affected area; return here if the recommended treatment, does not improve the symptoms; Recommended follow up-PCP and chiropractic, as needed     Final Clinical Impression(s) / ED Diagnoses Final diagnoses:  Chronic left-sided low back pain without sciatica    Rx / DC Orders ED Discharge Orders  None       Daleen Bo, MD 10/23/19 2013

## 2019-10-23 NOTE — Discharge Instructions (Addendum)
Do not drive or drink alcohol while taking the sedating medications.  Use heat on the area that is uncomfortable several times a day.  Follow-up with your doctor or chiropractor for further care as needed.

## 2020-04-03 ENCOUNTER — Encounter (HOSPITAL_COMMUNITY): Payer: Self-pay | Admitting: Emergency Medicine

## 2020-04-03 ENCOUNTER — Emergency Department (HOSPITAL_COMMUNITY)
Admission: EM | Admit: 2020-04-03 | Discharge: 2020-04-03 | Disposition: A | Discharge status: Home / Self Care | Payer: BC Managed Care – PPO | Attending: Emergency Medicine | Admitting: Emergency Medicine

## 2020-04-03 ENCOUNTER — Other Ambulatory Visit: Payer: Self-pay

## 2020-04-03 DIAGNOSIS — Z79899 Other long term (current) drug therapy: Secondary | ICD-10-CM | POA: Insufficient documentation

## 2020-04-03 DIAGNOSIS — G8929 Other chronic pain: Secondary | ICD-10-CM | POA: Diagnosis not present

## 2020-04-03 DIAGNOSIS — I10 Essential (primary) hypertension: Secondary | ICD-10-CM | POA: Diagnosis not present

## 2020-04-03 DIAGNOSIS — M545 Low back pain, unspecified: Secondary | ICD-10-CM

## 2020-04-03 DIAGNOSIS — M5459 Other low back pain: Secondary | ICD-10-CM | POA: Diagnosis not present

## 2020-04-03 MED ORDER — METHYLPREDNISOLONE 4 MG PO TBPK: ORAL_TABLET | ORAL | 0 refills | Status: DC

## 2020-04-03 MED ORDER — HYDROMORPHONE HCL 1 MG/ML IJ SOLN
1.0000 mg | Freq: Once | INTRAMUSCULAR | Status: AC
  Administered 2020-04-03: 1 mg via INTRAMUSCULAR
  Filled 2020-04-03: qty 1

## 2020-04-03 MED ORDER — CYCLOBENZAPRINE HCL 10 MG PO TABS: 10.0000 mg | ORAL_TABLET | Freq: Three times a day (TID) | ORAL | 0 refills | Status: AC

## 2020-04-03 MED ORDER — OXYCODONE HCL 5 MG PO TABS: 5.0000 mg | ORAL_TABLET | Freq: Four times a day (QID) | ORAL | 0 refills | Status: AC | PRN

## 2020-04-03 MED ORDER — IBUPROFEN 400 MG PO TABS
600.0000 mg | ORAL_TABLET | Freq: Once | ORAL | Status: AC
  Administered 2020-04-03: 600 mg via ORAL
  Filled 2020-04-03: qty 2

## 2020-04-03 MED ORDER — ACETAMINOPHEN 500 MG PO TABS
1000.0000 mg | ORAL_TABLET | Freq: Once | ORAL | Status: AC
  Administered 2020-04-03: 1000 mg via ORAL
  Filled 2020-04-03: qty 2

## 2020-04-03 MED ORDER — DEXAMETHASONE SODIUM PHOSPHATE 10 MG/ML IJ SOLN
10.0000 mg | Freq: Once | INTRAMUSCULAR | Status: AC
  Administered 2020-04-03: 10 mg via INTRAMUSCULAR
  Filled 2020-04-03: qty 1

## 2020-04-03 NOTE — ED Provider Notes (Signed)
[facility] EMERGENCY DEPARTMENT Provider Note   CSN: [id number] Arrival date & time: 04/03/20  1412     History Chief Complaint  Patient presents with  . Back Pain    Benjamin Farley is a 39 y.o. male with history of HTN, BMI 42, presents to ER for evaluation of back pain.  Reports chronic back issues for more than 10 years.  Has some degree of back pain daily. Occasionally it flares up. Back pain began last night felt it starting to get sore. Woke up th is morning and as he was trying to sit up from bed had sudden onset back pain. Located in middle of low back with radiation to left.  Pain is worse with position changes, standing up straight. Has tried ibuprofen today without relief.  He is a Copywriter, advertising. Denies recent falls, injury. No previous back surgeries or specific injuries. Came to ER 6 months ago and was prescribed medicines which helped. Has had back pain flares since that usually improves with OTC medicines including ibuprofen, TENS units, hemp and bio freeze cream.  Denies radiation of pain into legs, groin. No numbness or tingling in extremities. No problems with urination of bowel movements. No groin numbness. No fever, nausea, vomiting, abdominal pain.  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 Was told his back x-rays were normal. Has been waiting til 2022 for insurance to go to a specialist.     HPI     Past Medical History:  Diagnosis Date  . HTN (hypertension)     There are no problems to display for this patient.   Past Surgical History:  Procedure Laterality Date  . CHOLECYSTECTOMY         Family History  Problem Relation Age of Onset  . COPD Father   . Hypertension Father   . CVA Maternal Grandmother   . Hypertension Maternal Grandmother   . COPD Maternal Grandfather   . COPD Paternal Grandmother   . Heart attack Paternal Grandfather   . Hypertension Paternal Grandfather     Social History   Tobacco Use    . Smoking status: Never Smoker  . Smokeless tobacco: Never Used  Vaping Use  . Vaping Use: Never used  Substance Use Topics  . Alcohol use: Yes    Comment: occ  . Drug use: No    Home Medications Prior to Admission medications   Medication Sig Start Date End Date Taking? Authorizing Provider  cyclobenzaprine (FLEXERIL) 10 MG tablet Take 1 tablet (10 mg total) by mouth 3 (three) times daily for 7 days. 04/03/20 04/10/20  Liberty Handy, PA-C  diazepam (VALIUM) 10 MG tablet Take 1 tablet (10 mg total) by mouth every 8 (eight) hours as needed (Muscle spasm). 10/23/19   Mancel Bale, MD  hydrochlorothiazide (MICROZIDE) 12.5 MG capsule  08/28/16   [provider]  HYDROcodone-acetaminophen (NORCO) 5-325 MG tablet Take 1 tablet by mouth every 4 (four) hours as needed. 10/23/19   Mancel Bale, MD  methylPREDNISolone (MEDROL DOSEPAK) 4 MG TBPK tablet Take taper as prescribed 04/03/20   Liberty Handy, PA-C  oxyCODONE (OXY IR/ROXICODONE) 5 MG immediate release tablet Take 1 tablet (5 mg total) by mouth every 6 (six) hours as needed for up to 3 days for severe pain or breakthrough pain. 04/03/20 04/06/20  Liberty Handy, PA-C  predniSONE (DELTASONE) 20 MG tablet Take 1 tablet (20 mg total) by mouth 2 (two) times daily. 10/23/19  Mancel Bale, MD    Allergies    Patient has no known allergies.  Review of Systems   Review of Systems  Musculoskeletal: Positive for back pain.  All other systems reviewed and are negative.   Physical Exam Updated Vital Signs BP (!) 146/113 (BP Location: Right Arm)   Pulse (!) 116   Temp 98.2 F (36.8 C) (Oral)   Resp (!) 22   Ht 6\' 1"  (1.854 m)   Wt (!) 145.2 kg   SpO2 97%   BMI 42.22 kg/m   Physical Exam Constitutional:      General: He is not in acute distress.    Appearance: He is well-developed.  HENT:     Head: Normocephalic and atraumatic.     Nose: Nose normal.  Neck:     Comments: c-spine: no midline or paraspinal  tenderness Cardiovascular:     Rate and Rhythm: Normal rate.     Pulses:          Radial pulses are 2+ on the right side and 2+ on the left side.       Dorsalis pedis pulses are 2+ on the right side and 2+ on the left side.     Heart sounds: Normal heart sounds.  Pulmonary:     Effort: Pulmonary effort is normal.     Breath sounds: Normal breath sounds.  Abdominal:     Palpations: Abdomen is soft.     Tenderness: There is no abdominal tenderness.     Comments: No suprapubic or CVA tenderness   Musculoskeletal:        General: Tenderness present.     Lumbar back: Tenderness present.     Comments: T-spine: no midline or paraspinal tenderness L-spine: mild midline tenderness, no step offs. TTP left paraspinal muscles.  No SI or sciatic notch tenderness. Negative SLR bilaterally.  Pelvis: no pain or crepitus with IR/ER/downward pressure of hips bilaterally.   Skin:    General: Skin is warm and dry.     Capillary Refill: Capillary refill takes less than 2 seconds.     Comments: No overlaying rash to back   Neurological:     Mental Status: He is alert.     Sensory: No sensory deficit.     Comments: Can lift and hold legs without unilateral weakness or drift 5/5 strength with flexion/extension of hip, knee and ankle, bilaterally.  Sensation to light touch intact in lower extremities including feet  Psychiatric:        Behavior: Behavior normal.        Thought Content: Thought content normal.     ED Results / Procedures / Treatments   Labs (all labs ordered are listed, but only abnormal results are displayed) Labs Reviewed - No data to display  EKG None  Radiology No results found.  Procedures Procedures (including critical care time)  Medications Ordered in ED Medications  HYDROmorphone (DILAUDID) injection 1 mg (1 mg Intramuscular Given 04/03/20 1846)  dexamethasone (DECADRON) injection 10 mg (10 mg Intramuscular Given 04/03/20 1846)  acetaminophen (TYLENOL) tablet 1,000  mg (1,000 mg Oral Given 04/03/20 1839)  ibuprofen (ADVIL) tablet 600 mg (600 mg Oral Given 04/03/20 1839)    ED Course  I have reviewed the triage vital signs and the nursing notes.  Pertinent labs & imaging results that were available during my care of the patient were reviewed by me and considered in my medical decision making (see chart for details).  Clinical Course as of Apr 03 3148  Sun Apr 03, 2020  1809 Hunt Oris     [CG]    Clinical Course User Index [CG] Jerrell Mylar   MDM Rules/Calculators/A&P                          EMR triage and nursing notes reviewed to obtain more history and MDM  No x-ray on our records. ER visit for same 09/2019. Discharge with percocet, valium, prednisone.   MSK shows midline left sided lumbar spine tenderness. Strength is intact.  Sensation intact. Abdominal exam benign, without pulsatility, suprapubic or CVA tenderness. Distal pulses symmetric bilaterally. No overlaying rash.   Highest on ddx is muscular strain vs spasm vs DDD.  No radicular symptoms and bulging disc vs radicular pain less likely.    No red flag features of back pain present such as saddle anesthesia, bladder/bowel incontinence or retention, fevers, h/o cancer, IVDU, preceding trauma or falls, neuro deficits, urinary symptoms.    Considered other causes of back pain like UTI/pyelo, kidney stone, cauda equina, epidural abscess, dissection unlikely as these don't fit clinical picture.   Emergent imaging not thought to be indicated today as physical exam reassuring. No trauma. Patient agrees.    Will discharge with high dose NSAIDs, flexeril, short course of oxycodone for break through pain. Patient advised repeat prescriptions from ER would not be done in the future, need for PCP/specialist follow up for chronic pain.  No radicular symptoms but pain not improving with NSAIDs, will try trial of steroidal medicine.  Patient given ortho/spine contact info for  follow up. Return precautions given.   Final Clinical Impression(s) / ED Diagnoses Final diagnoses:  Chronic left-sided low back pain without sciatica    Rx / DC Orders ED Discharge Orders         Ordered    cyclobenzaprine (FLEXERIL) 10 MG tablet  3 times daily        04/03/20 1854    oxyCODONE (OXY IR/ROXICODONE) 5 MG immediate release tablet  Every 6 hours PRN        04/03/20 1854    methylPREDNISolone (MEDROL DOSEPAK) 4 MG TBPK tablet        04/03/20 1854           Jerrell Mylar 04/03/20 1854    Benjiman Core, MD 04/04/20 [phone]

## 2020-04-03 NOTE — ED Triage Notes (Signed)
Pt c/o back pain that started last night. Pt states he is unable to sit or bend without great difficulty.

## 2020-04-03 NOTE — Discharge Instructions (Addendum)
You you were seen in the emergency department for back pain.  I suspect your pain is either from a muscular overuse injury, spasm, strain or possible disc or nerve inflammation.  All of these are treated similarly.   We will treat this with a combination of anti-inflammatories, muscle relaxers, pain medicine . [phone] mg of acetaminophen (Tylenol) every 6 hours for the next 3-5 days 600 mg ibuprofen every 6-8 hours for the next 3-5 days  You can add acetaminophen and ibuprofen as above every 6-8 hours for maximum pain control. Do not exceed more than 4,000 mg acetaminophen or 2,400 mg ibuprofen in a 24 hour period.  Do not take ibuprofen containing products if you are pregnant, have history of kidney disease, ulcers, GI bleeding, severe acid reflux, or take a blood thinner.  Do not take acetaminophen if you have liver disease.  10 mg flexeril every 8 hours for the next 3-5 days for associated spasms and tightness Over-the-counter lidocaine-containing patches can be applied to the area of pain for 12 hours at a time Apply/rub diclofenac sodium gel on muscles until it feels dry every 6-8 hours, then can use heating pad for 20-30 mins to help absorb Rest for the next 48 hours to avoid further injury Transition back into daily activity slowly to avoid reinjury  A short course of oxycodone 5 mg every 6 hours has been prescribed. Use this for severe or break through pain.  Further refills or prescriptions of this medicine will not be done in the ER.   Symptoms should improve over the next 48-72 hours and slowly resolve over a course of 7-10 days   Follow up with primary care doctor in 7-10 days if symptoms not improving. See below for contact information for orthopedist in [place] and neurosurgery in [place]. You will likely need specialist evaluation   Return to the ER if your pain worsens, you develop abdominal pain, urinary symptoms, changes to your bowel movements, numbness in your groin,  loss of bladder or bowel control, loss of sensation or heaviness or weakness to your extremities, fevers

## 2020-09-05 DIAGNOSIS — K429 Umbilical hernia without obstruction or gangrene: Secondary | ICD-10-CM | POA: Diagnosis not present

## 2020-09-05 DIAGNOSIS — Z681 Body mass index (BMI) 19 or less, adult: Secondary | ICD-10-CM | POA: Diagnosis not present

## 2020-09-05 DIAGNOSIS — I10 Essential (primary) hypertension: Secondary | ICD-10-CM | POA: Diagnosis not present

## 2021-05-05 DIAGNOSIS — I10 Essential (primary) hypertension: Secondary | ICD-10-CM | POA: Diagnosis not present

## 2021-05-05 DIAGNOSIS — Z0001 Encounter for general adult medical examination with abnormal findings: Secondary | ICD-10-CM | POA: Diagnosis not present

## 2021-05-05 DIAGNOSIS — Z6841 Body Mass Index (BMI) 40.0 and over, adult: Secondary | ICD-10-CM | POA: Diagnosis not present

## 2021-05-05 DIAGNOSIS — Z1331 Encounter for screening for depression: Secondary | ICD-10-CM | POA: Diagnosis not present

## 2021-05-05 DIAGNOSIS — K429 Umbilical hernia without obstruction or gangrene: Secondary | ICD-10-CM | POA: Diagnosis not present

## 2021-05-25 ENCOUNTER — Encounter: Payer: Self-pay | Admitting: General Surgery

## 2021-05-25 ENCOUNTER — Ambulatory Visit (INDEPENDENT_AMBULATORY_CARE_PROVIDER_SITE_OTHER): Payer: BC Managed Care – PPO | Admitting: General Surgery

## 2021-05-25 ENCOUNTER — Other Ambulatory Visit: Payer: Self-pay

## 2021-05-25 VITALS — BP 144/89 | HR 88 | Temp 97.8°F | Resp 16 | Ht 73.0 in | Wt 376.0 lb

## 2021-05-25 DIAGNOSIS — K432 Incisional hernia without obstruction or gangrene: Secondary | ICD-10-CM

## 2021-05-25 NOTE — Progress Notes (Signed)
Benjamin Farley; [id number]; 1980-11-19   HPI Patient is a 41 year old white male who was referred to my care by Dr. Sharilyn Sites for evaluation and treatment of an umbilical hernia.  He has been present for some time now.  He did develop just underneath a surgical scar from his laparoscopic cholecystectomy.  Is made worse with straining.  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 Past Medical History:  Diagnosis Date   HTN (hypertension)     Past Surgical History:  Procedure Laterality Date   CHOLECYSTECTOMY      Family History  Problem Relation Age of Onset   COPD Father    Hypertension Father    CVA Maternal Grandmother    Hypertension Maternal Grandmother    COPD Maternal Grandfather    COPD Paternal Grandmother    Heart attack Paternal Grandfather    Hypertension Paternal Grandfather     Current Outpatient Medications on File Prior to Visit  Medication Sig Dispense Refill   amLODipine (NORVASC) 5 MG tablet Take by mouth.     hydrochlorothiazide (MICROZIDE) 12.5 MG capsule      rosuvastatin (CRESTOR) 10 MG tablet Take 10 mg by mouth daily.     No current facility-administered medications on file prior to visit.    No Known Allergies  Social History   Substance and Sexual Activity  Alcohol Use Yes   Comment: occ    Social History   Tobacco Use  Smoking Status Never  Smokeless Tobacco Never    Review of Systems  Constitutional: Negative.   HENT: Negative.    Eyes: Negative.   Respiratory: Negative.    Cardiovascular: Negative.   Gastrointestinal:  Positive for heartburn.  Genitourinary: Negative.   Musculoskeletal:  Positive for back pain.  Skin: Negative.   Neurological: Negative.   Endo/Heme/Allergies: Negative.   Psychiatric/Behavioral: Negative.     Objective   Vitals:   05/25/21 1036  BP: (!) 144/89  Pulse: 88  Resp: 16  Temp: 97.8 F (36.6 C)  SpO2: 97%    Physical Exam Vitals  reviewed.  Constitutional:      Appearance: Normal appearance. He is obese. He is not ill-appearing.  HENT:     Head: Normocephalic and atraumatic.  Cardiovascular:     Rate and Rhythm: Normal rate and regular rhythm.     Heart sounds: Normal heart sounds. No murmur heard.   No friction rub. No gallop.  Pulmonary:     Effort: Pulmonary effort is normal. No respiratory distress.     Breath sounds: Normal breath sounds. No stridor. No wheezing, rhonchi or rales.  Abdominal:     General: Bowel sounds are normal. There is no distension.     Palpations: Abdomen is soft. There is no mass.     Tenderness: There is abdominal tenderness. There is no guarding or rebound.     Hernia: A hernia is present.     Comments: A 2 cm supraumbilical reducible hernia beneath a surgical scar is present.  Skin:    General: Skin is warm and dry.  Neurological:     Mental Status: He is alert and oriented to person, place, and time.   Primary care notes reviewed Assessment  Incisional hernia Plan  Patient is scheduled for an incisional herniorrhaphy with mesh on 06/16/2021.  The risks and benefits of the procedure including bleeding, infection, mesh use, and the possibility of recurrence of the hernia were fully explained  to the patient, who gave informed consent.

## 2021-05-30 NOTE — H&P (Signed)
Isabel M Konigsberg; [id number]; 08/28/1980 ° ° °HPI °Patient is a 41-year-old white male who was referred to my care by Dr. John Golding for evaluation and treatment of an umbilical hernia.  He has been present for some time now.  He did develop just underneath a surgical scar from his laparoscopic cholecystectomy.  Is made worse with straining.  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 °Past Medical History:  °Diagnosis Date  ° HTN (hypertension)   ° ° °Past Surgical History:  °Procedure Laterality Date  ° CHOLECYSTECTOMY    ° ° °Family History  °Problem Relation Age of Onset  ° COPD Father   ° Hypertension Father   ° CVA Maternal Grandmother   ° Hypertension Maternal Grandmother   ° COPD Maternal Grandfather   ° COPD Paternal Grandmother   ° Heart attack Paternal Grandfather   ° Hypertension Paternal Grandfather   ° ° °Current Outpatient Medications on File Prior to Visit  °Medication Sig Dispense Refill  ° amLODipine (NORVASC) 5 MG tablet Take by mouth.    ° hydrochlorothiazide (MICROZIDE) 12.5 MG capsule     ° rosuvastatin (CRESTOR) 10 MG tablet Take 10 mg by mouth daily.    ° °No current facility-administered medications on file prior to visit.  ° ° °No Known Allergies ° °Social History  ° °Substance and Sexual Activity  °Alcohol Use Yes  ° Comment: occ  ° ° °Social History  ° °Tobacco Use  °Smoking Status Never  °Smokeless Tobacco Never  ° ° °Review of Systems  °Constitutional: Negative.   °HENT: Negative.    °Eyes: Negative.   °Respiratory: Negative.    °Cardiovascular: Negative.   °Gastrointestinal:  Positive for heartburn.  °Genitourinary: Negative.   °Musculoskeletal:  Positive for back pain.  °Skin: Negative.   °Neurological: Negative.   °Endo/Heme/Allergies: Negative.   °Psychiatric/Behavioral: Negative.    ° °Objective  ° °Vitals:  ° 05/25/21 1036  °BP: (!) 144/89  °Pulse: 88  °Resp: 16  °Temp: 97.8 °F (36.6 °C)  °SpO2: 97%  ° ° °Physical Exam °Vitals  reviewed.  °Constitutional:   °   Appearance: Normal appearance. He is obese. He is not ill-appearing.  °HENT:  °   Head: Normocephalic and atraumatic.  °Cardiovascular:  °   Rate and Rhythm: Normal rate and regular rhythm.  °   Heart sounds: Normal heart sounds. No murmur heard. °  No friction rub. No gallop.  °Pulmonary:  °   Effort: Pulmonary effort is normal. No respiratory distress.  °   Breath sounds: Normal breath sounds. No stridor. No wheezing, rhonchi or rales.  °Abdominal:  °   General: Bowel sounds are normal. There is no distension.  °   Palpations: Abdomen is soft. There is no mass.  °   Tenderness: There is abdominal tenderness. There is no guarding or rebound.  °   Hernia: A hernia is present.  °   Comments: A 2 cm supraumbilical reducible hernia beneath a surgical scar is present.  °Skin: °   General: Skin is warm and dry.  °Neurological:  °   Mental Status: He is alert and oriented to person, place, and time.  ° °Primary care notes reviewed °Assessment  °Incisional hernia °Plan  °Patient is scheduled for an incisional herniorrhaphy with mesh on 06/16/2021.  The risks and benefits of the procedure including bleeding, infection, mesh use, and the possibility of recurrence of the hernia were fully explained   to the patient, who gave informed consent. °

## 2021-06-13 NOTE — Patient Instructions (Signed)
Algis GreenhouseJason M Hilbun  06/13/2021     @PREFPERIOPPHARMACY @   Your procedure is scheduled on  06/16/2021.   [facility] at  [phone]  A.M.   Call this number if you have problems the morning of surgery:  [phone]   Remember:  Do not eat or drink after midnight.      Take these medicines the morning of surgery with A SIP OF WATER                           None    Do not wear jewelry, make-up or nail polish.  Do not wear lotions, powders, or perfumes, or deodorant.  Do not shave 48 hours prior to surgery.  Men may shave face and neck.  Do not bring valuables to the hospital.  [facility] is not responsible for any belongings or valuables.  Contacts, dentures or bridgework may not be worn into surgery.  Leave your suitcase in the car.  After surgery it may be brought to your room.  For patients admitted to the hospital, discharge time will be determined by your treatment team.  Patients discharged the day of surgery will not be allowed to drive home and must have someone with them for 24 hours.    Special instructions:   DO NOT smoke tobacco or vape for 24 hours before your procedure.  Please read over the following fact sheets that you were given. Coughing and Deep Breathing, Surgical Site Infection Prevention, Anesthesia Post-op Instructions, and Care and Recovery After Surgery      Open Hernia Repair, Adult, Care After What can I expect after the procedure? After the procedure, it is common to have: Mild discomfort. Slight bruising. Mild swelling. Pain in the belly (abdomen). A small amount of blood from the cut from surgery (incision). Follow these instructions at home: Your doctor may give you more specific instructions. If you have problems, call your doctor. Medicines Take over-the-counter and prescription medicines only as told by your doctor. If told, take steps to prevent problems with pooping (constipation). You may need to: Drink enough  fluid to keep your pee (urine) pale yellow. Take medicines. You will be told what medicines to take. Eat foods that are high in fiber. These include beans, whole grains, and fresh fruits and vegetables. Limit foods that are high in fat and sugar. These include fried or sweet foods. Ask your doctor if you should avoid driving or using machines while you are taking your medicine. Incision care  Follow instructions from your doctor about how to take care of your incision. Make sure you: Wash your hands with soap and water for at least 20 seconds before and after you change your bandage (dressing). If you cannot use soap and water, use hand sanitizer. Change your bandage. Leave stitches or skin glue in place for at least 2 weeks. Leave tape strips alone unless you are told to take them off. You may trim the edges of the tape strips if they curl up. Check your incision every day for signs of infection. Check for: More redness, swelling, or pain. More fluid or blood. Warmth. Pus or a bad smell. Wear loose, soft clothing while your incision heals. Activity  Rest as told by your doctor. Do not lift anything that is heavier than 10 lb (4.5 kg), or the limit that you are told. Do not play contact sports until your doctor  says that this is safe. If you were given a sedative during your procedure, do not drive or use machines until your doctor says that it is safe. A sedative is a medicine that helps you relax. Return to your normal activities when your doctor says that it is safe. General instructions Do not take baths, swim, or use a hot tub. Ask your doctor about taking showers or sponge baths. Hold a pillow over your belly when you cough or sneeze. This helps with pain. Do not smoke or use any products that contain nicotine or tobacco. If you need help quitting, ask your doctor. Keep all follow-up visits. Contact a doctor if: You have any of these signs of infection in or around your  incision: More redness, swelling, or pain. More fluid or blood. Warmth. Pus. A bad smell. You have a fever or chills. You have blood in your poop (stool). You have not pooped (had a bowel movement) in 2-3 days. Medicine does not help your pain. Get help right away if: You have chest pain, or you are short of breath. You feel faint or light-headed. You have very bad pain. You vomit and your pain is worse. You have pain, swelling, or redness in a leg. These symptoms may be an emergency. Get help right away. Call your local emergency services (911 in the [place] Do not wait to see if the symptoms will go away. Do not drive yourself to the hospital. Summary After this procedure, it is common to have mild discomfort, slight bruising, and mild swelling. Follow instructions from your doctor about how to take care of your cut from surgery (incision). Check every day for signs of infection. Do not lift heavy objects or play contact sports until your doctor says it is safe. Return to your normal activities as told by your doctor. This information is not intended to replace advice given to you by your health care provider. Make sure you discuss any questions you have with your health care provider. Document Revised: 11/30/2019 Document Reviewed: 11/30/2019 Elsevier Patient Education  2022 Elsevier Inc. General Anesthesia, Adult, Care After This sheet gives you information about how to care for yourself after your procedure. Your health care provider may also give you more specific instructions. If you have problems or questions, contact your health care provider. What can I expect after the procedure? After the procedure, the following side effects are common: Pain or discomfort at the IV site. Nausea. Vomiting. Sore throat. Trouble concentrating. Feeling cold or chills. Feeling weak or tired. Sleepiness and fatigue. Soreness and body aches. These side effects can affect parts of the body  that were not involved in surgery. Follow these instructions at home: For the time period you were told by your health care provider:  Rest. Do not participate in activities where you could fall or become injured. Do not drive or use machinery. Do not drink alcohol. Do not take sleeping pills or medicines that cause drowsiness. Do not make important decisions or sign legal documents. Do not take care of children on your own. Eating and drinking Follow any instructions from your health care provider about eating or drinking restrictions. When you feel hungry, start by eating small amounts of foods that are soft and easy to digest (bland), such as toast. Gradually return to your regular diet. Drink enough fluid to keep your urine pale yellow. If you vomit, rehydrate by drinking water, juice, or clear broth. General instructions If you have sleep apnea, surgery and  certain medicines can increase your risk for breathing problems. Follow instructions from your health care provider about wearing your sleep device: Anytime you are sleeping, including during daytime naps. While taking prescription pain medicines, sleeping medicines, or medicines that make you drowsy. Have a responsible adult stay with you for the time you are told. It is important to have someone help care for you until you are awake and alert. Return to your normal activities as told by your health care provider. Ask your health care provider what activities are safe for you. Take over-the-counter and prescription medicines only as told by your health care provider. If you smoke, do not smoke without supervision. Keep all follow-up visits as told by your health care provider. This is important. Contact a health care provider if: You have nausea or vomiting that does not get better with medicine. You cannot eat or drink without vomiting. You have pain that does not get better with medicine. You are unable to pass urine. You  develop a skin rash. You have a fever. You have redness around your IV site that gets worse. Get help right away if: You have difficulty breathing. You have chest pain. You have blood in your urine or stool, or you vomit blood. Summary After the procedure, it is common to have a sore throat or nausea. It is also common to feel tired. Have a responsible adult stay with you for the time you are told. It is important to have someone help care for you until you are awake and alert. When you feel hungry, start by eating small amounts of foods that are soft and easy to digest (bland), such as toast. Gradually return to your regular diet. Drink enough fluid to keep your urine pale yellow. Return to your normal activities as told by your health care provider. Ask your health care provider what activities are safe for you. This information is not intended to replace advice given to you by your health care provider. Make sure you discuss any questions you have with your health care provider. Document Revised: 12/31/2019 Document Reviewed: 07/30/2019 Elsevier Patient Education  2022 Elsevier Inc. How to Use Chlorhexidine for Bathing Chlorhexidine gluconate (CHG) is a germ-killing (antiseptic) solution that is used to clean the skin. It can get rid of the bacteria that normally live on the skin and can keep them away for about 24 hours. To clean your skin with CHG, you may be given: A CHG solution to use in the shower or as part of a sponge bath. A prepackaged cloth that contains CHG. Cleaning your skin with CHG may help lower the risk for infection: While you are staying in the intensive care unit of the hospital. If you have a vascular access, such as a central line, to provide short-term or long-term access to your veins. If you have a catheter to drain urine from your bladder. If you are on a ventilator. A ventilator is a machine that helps you breathe by moving air in and out of your lungs. After  surgery. What are the risks? Risks of using CHG include: A skin reaction. Hearing loss, if CHG gets in your ears and you have a perforated eardrum. Eye injury, if CHG gets in your eyes and is not rinsed out. The CHG product catching fire. Make sure that you avoid smoking and flames after applying CHG to your skin. Do not use CHG: If you have a chlorhexidine allergy or have previously reacted to chlorhexidine. On babies younger  than 69 months of age. How to use CHG solution Use CHG only as told by your health care provider, and follow the instructions on the label. Use the full amount of CHG as directed. Usually, this is one bottle. During a shower Follow these steps when using CHG solution during a shower (unless your health care provider gives you different instructions): Start the shower. Use your normal soap and shampoo to wash your face and hair. Turn off the shower or move out of the shower stream. Pour the CHG onto a clean washcloth. Do not use any type of brush or rough-edged sponge. Starting at your neck, lather your body down to your toes. Make sure you follow these instructions: If you will be having surgery, pay special attention to the part of your body where you will be having surgery. Scrub this area for at least 1 minute. Do not use CHG on your head or face. If the solution gets into your ears or eyes, rinse them well with water. Avoid your genital area. Avoid any areas of skin that have broken skin, cuts, or scrapes. Scrub your back and under your arms. Make sure to wash skin folds. Let the lather sit on your skin for 1-2 minutes or as long as told by your health care provider. Thoroughly rinse your entire body in the shower. Make sure that all body creases and crevices are rinsed well. Dry off with a clean towel. Do not put any substances on your body afterward--such as powder, lotion, or perfume--unless you are told to do so by your health care provider. Only use lotions  that are recommended by the manufacturer. Put on clean clothes or pajamas. If it is the night before your surgery, sleep in clean sheets.  During a sponge bath Follow these steps when using CHG solution during a sponge bath (unless your health care provider gives you different instructions): Use your normal soap and shampoo to wash your face and hair. Pour the CHG onto a clean washcloth. Starting at your neck, lather your body down to your toes. Make sure you follow these instructions: If you will be having surgery, pay special attention to the part of your body where you will be having surgery. Scrub this area for at least 1 minute. Do not use CHG on your head or face. If the solution gets into your ears or eyes, rinse them well with water. Avoid your genital area. Avoid any areas of skin that have broken skin, cuts, or scrapes. Scrub your back and under your arms. Make sure to wash skin folds. Let the lather sit on your skin for 1-2 minutes or as long as told by your health care provider. Using a different clean, wet washcloth, thoroughly rinse your entire body. Make sure that all body creases and crevices are rinsed well. Dry off with a clean towel. Do not put any substances on your body afterward--such as powder, lotion, or perfume--unless you are told to do so by your health care provider. Only use lotions that are recommended by the manufacturer. Put on clean clothes or pajamas. If it is the night before your surgery, sleep in clean sheets. How to use CHG prepackaged cloths Only use CHG cloths as told by your health care provider, and follow the instructions on the label. Use the CHG cloth on clean, dry skin. Do not use the CHG cloth on your head or face unless your health care provider tells you to. When washing with the CHG cloth:  Avoid your genital area. Avoid any areas of skin that have broken skin, cuts, or scrapes. Before surgery Follow these steps when using a CHG cloth to  clean before surgery (unless your health care provider gives you different instructions): Using the CHG cloth, vigorously scrub the part of your body where you will be having surgery. Scrub using a back-and-forth motion for 3 minutes. The area on your body should be completely wet with CHG when you are done scrubbing. Do not rinse. Discard the cloth and let the area air-dry. Do not put any substances on the area afterward, such as powder, lotion, or perfume. Put on clean clothes or pajamas. If it is the night before your surgery, sleep in clean sheets.  For general bathing Follow these steps when using CHG cloths for general bathing (unless your health care provider gives you different instructions). Use a separate CHG cloth for each area of your body. Make sure you wash between any folds of skin and between your fingers and toes. Wash your body in the following order, switching to a new cloth after each step: The front of your neck, shoulders, and chest. Both of your arms, under your arms, and your hands. Your stomach and groin area, avoiding the genitals. Your right leg and foot. Your left leg and foot. The back of your neck, your back, and your buttocks. Do not rinse. Discard the cloth and let the area air-dry. Do not put any substances on your body afterward--such as powder, lotion, or perfume--unless you are told to do so by your health care provider. Only use lotions that are recommended by the manufacturer. Put on clean clothes or pajamas. Contact a health care provider if: Your skin gets irritated after scrubbing. You have questions about using your solution or cloth. You swallow any chlorhexidine. Call your local poison control center ([phone] in the [place] Get help right away if: Your eyes itch badly, or they become very red or swollen. Your skin itches badly and is red or swollen. Your hearing changes. You have trouble seeing. You have swelling or tingling in your mouth or  throat. You have trouble breathing. These symptoms may represent a serious problem that is an emergency. Do not wait to see if the symptoms will go away. Get medical help right away. Call your local emergency services (911 in the [place] Do not drive yourself to the hospital. Summary Chlorhexidine gluconate (CHG) is a germ-killing (antiseptic) solution that is used to clean the skin. Cleaning your skin with CHG may help to lower your risk for infection. You may be given CHG to use for bathing. It may be in a bottle or in a prepackaged cloth to use on your skin. Carefully follow your health care provider's instructions and the instructions on the product label. Do not use CHG if you have a chlorhexidine allergy. Contact your health care provider if your skin gets irritated after scrubbing. This information is not intended to replace advice given to you by your health care provider. Make sure you discuss any questions you have with your health care provider. Document Revised: 06/27/2020 Document Reviewed: 06/27/2020 Elsevier Patient Education  2022 [employer]

## 2021-06-14 ENCOUNTER — Encounter (HOSPITAL_COMMUNITY)
Admission: RE | Admit: 2021-06-14 | Discharge: 2021-06-14 | Disposition: A | Discharge status: Home / Self Care | Payer: BC Managed Care – PPO | Source: Ambulatory Visit | Attending: General Surgery | Admitting: General Surgery

## 2021-06-14 ENCOUNTER — Encounter (HOSPITAL_COMMUNITY): Payer: Self-pay

## 2021-06-14 VITALS — BP 144/93 | HR 86 | Temp 97.7°F | Resp 18 | Ht 73.0 in | Wt 360.0 lb

## 2021-06-14 DIAGNOSIS — K432 Incisional hernia without obstruction or gangrene: Secondary | ICD-10-CM | POA: Diagnosis not present

## 2021-06-14 DIAGNOSIS — Z79899 Other long term (current) drug therapy: Secondary | ICD-10-CM | POA: Insufficient documentation

## 2021-06-14 DIAGNOSIS — K429 Umbilical hernia without obstruction or gangrene: Secondary | ICD-10-CM | POA: Diagnosis not present

## 2021-06-14 DIAGNOSIS — Z01818 Encounter for other preprocedural examination: Secondary | ICD-10-CM | POA: Insufficient documentation

## 2021-06-14 DIAGNOSIS — I10 Essential (primary) hypertension: Secondary | ICD-10-CM | POA: Diagnosis not present

## 2021-06-14 LAB — BASIC METABOLIC PANEL
Anion gap: 12 (ref 5–15)
BUN: 18 mg/dL (ref 6–20)
CO2: 21 mmol/L — ABNORMAL LOW (ref 22–32)
Calcium: 8.8 mg/dL — ABNORMAL LOW (ref 8.9–10.3)
Chloride: 104 mmol/L (ref 98–111)
Creatinine, Ser: 0.92 mg/dL (ref 0.61–1.24)
GFR, Estimated: >60 mL/min (ref >60)
Glucose, Bld: 101 mg/dL — ABNORMAL HIGH (ref 70–99)
Potassium: 3.7 mmol/L (ref 3.5–5.1)
Sodium: 137 mmol/L (ref 135–145)

## 2021-06-14 NOTE — Progress Notes (Signed)
°   06/14/21 0852  OBSTRUCTIVE SLEEP APNEA  Have you ever been diagnosed with sleep apnea through a sleep study? No  Do you snore loudly (loud enough to be heard through closed doors)?  1  Do you often feel tired, fatigued, or sleepy during the daytime (such as falling asleep during driving or talking to someone)? 0  Has anyone observed you stop breathing during your sleep? 0  Do you have, or are you being treated for high blood pressure? 1  BMI more than 35 kg/m2? 1  Age > 50 (1-yes) 0  Neck circumference greater than:Male 16 inches or larger, Male 17inches or larger? 1  Male Gender (Yes=1) 1  Obstructive Sleep Apnea Score 5  Score 5 or greater  Results sent to PCP

## 2021-06-16 ENCOUNTER — Ambulatory Visit (HOSPITAL_COMMUNITY): Payer: BC Managed Care – PPO | Admitting: Anesthesiology

## 2021-06-16 ENCOUNTER — Other Ambulatory Visit: Payer: Self-pay

## 2021-06-16 ENCOUNTER — Encounter (HOSPITAL_COMMUNITY)
Admission: RE | Disposition: A | Discharge status: Home / Self Care | Payer: Self-pay | Source: Home / Self Care | Attending: General Surgery

## 2021-06-16 ENCOUNTER — Encounter (HOSPITAL_COMMUNITY): Payer: Self-pay | Admitting: General Surgery

## 2021-06-16 ENCOUNTER — Ambulatory Visit (HOSPITAL_COMMUNITY)
Admission: RE | Admit: 2021-06-16 | Discharge: 2021-06-16 | Disposition: A | Discharge status: Home / Self Care | Payer: BC Managed Care – PPO | Attending: General Surgery | Admitting: General Surgery

## 2021-06-16 DIAGNOSIS — K429 Umbilical hernia without obstruction or gangrene: Secondary | ICD-10-CM | POA: Diagnosis not present

## 2021-06-16 DIAGNOSIS — K432 Incisional hernia without obstruction or gangrene: Secondary | ICD-10-CM | POA: Diagnosis not present

## 2021-06-16 DIAGNOSIS — I10 Essential (primary) hypertension: Secondary | ICD-10-CM | POA: Insufficient documentation

## 2021-06-16 DIAGNOSIS — Z79899 Other long term (current) drug therapy: Secondary | ICD-10-CM | POA: Diagnosis not present

## 2021-06-16 HISTORY — PX: INCISIONAL HERNIA REPAIR: SHX193

## 2021-06-16 SURGERY — REPAIR, HERNIA, INCISIONAL
Anesthesia: General | Site: Abdomen

## 2021-06-16 MED ORDER — CEFAZOLIN SODIUM-DEXTROSE 2-4 GM/100ML-% IV SOLN
INTRAVENOUS | Status: AC
  Filled 2021-06-16: qty 100

## 2021-06-16 MED ORDER — LIDOCAINE 2% (20 MG/ML) 5 ML SYRINGE
INTRAMUSCULAR | Status: DC | PRN
  Administered 2021-06-16: 100 mg via INTRAVENOUS

## 2021-06-16 MED ORDER — SUGAMMADEX SODIUM 200 MG/2ML IV SOLN
INTRAVENOUS | Status: DC | PRN
Start: 2021-06-16 — End: 2021-06-16
  Administered 2021-06-16: 400 mg via INTRAVENOUS

## 2021-06-16 MED ORDER — SUCCINYLCHOLINE CHLORIDE 200 MG/10ML IV SOSY
PREFILLED_SYRINGE | INTRAVENOUS | Status: DC | PRN
  Administered 2021-06-16: 140 mg via INTRAVENOUS

## 2021-06-16 MED ORDER — ROCURONIUM BROMIDE 10 MG/ML (PF) SYRINGE
PREFILLED_SYRINGE | INTRAVENOUS | Status: AC
  Filled 2021-06-16: qty 30

## 2021-06-16 MED ORDER — PROPOFOL 10 MG/ML IV BOLUS
INTRAVENOUS | Status: AC
  Filled 2021-06-16: qty 20

## 2021-06-16 MED ORDER — DEXAMETHASONE SODIUM PHOSPHATE 10 MG/ML IJ SOLN
INTRAMUSCULAR | Status: DC | PRN
  Administered 2021-06-16: 10 mg via INTRAVENOUS

## 2021-06-16 MED ORDER — BUPIVACAINE LIPOSOME 1.3 % IJ SUSP
INTRAMUSCULAR | Status: AC
  Filled 2021-06-16: qty 20

## 2021-06-16 MED ORDER — 0.9 % SODIUM CHLORIDE (POUR BTL) OPTIME
TOPICAL | Status: DC | PRN
  Administered 2021-06-16: 1000 mL

## 2021-06-16 MED ORDER — LIDOCAINE HCL (PF) 2 % IJ SOLN
INTRAMUSCULAR | Status: AC
  Filled 2021-06-16: qty 5

## 2021-06-16 MED ORDER — HYDROCODONE-ACETAMINOPHEN 5-325 MG PO TABS: 1.0000 | ORAL_TABLET | ORAL | 0 refills | Status: DC | PRN

## 2021-06-16 MED ORDER — CHLORHEXIDINE GLUCONATE 0.12 % MT SOLN
15.0000 mL | Freq: Once | OROMUCOSAL | Status: AC
  Administered 2021-06-16: 15 mL via OROMUCOSAL

## 2021-06-16 MED ORDER — DEXAMETHASONE SODIUM PHOSPHATE 10 MG/ML IJ SOLN
INTRAMUSCULAR | Status: AC
  Filled 2021-06-16: qty 1

## 2021-06-16 MED ORDER — FENTANYL CITRATE (PF) 100 MCG/2ML IJ SOLN
INTRAMUSCULAR | Status: AC
  Filled 2021-06-16: qty 2

## 2021-06-16 MED ORDER — BUPIVACAINE LIPOSOME 1.3 % IJ SUSP
INTRAMUSCULAR | Status: DC | PRN
  Administered 2021-06-16: 20 mL

## 2021-06-16 MED ORDER — FENTANYL CITRATE (PF) 100 MCG/2ML IJ SOLN
INTRAMUSCULAR | Status: DC | PRN
  Administered 2021-06-16: 100 ug via INTRAVENOUS

## 2021-06-16 MED ORDER — ONDANSETRON HCL 4 MG/2ML IJ SOLN
INTRAMUSCULAR | Status: DC | PRN
  Administered 2021-06-16: 4 mg via INTRAVENOUS

## 2021-06-16 MED ORDER — MIDAZOLAM HCL 5 MG/5ML IJ SOLN
INTRAMUSCULAR | Status: DC | PRN
Start: 2021-06-16 — End: 2021-06-16
  Administered 2021-06-16: 2 mg via INTRAVENOUS

## 2021-06-16 MED ORDER — CHLORHEXIDINE GLUCONATE CLOTH 2 % EX PADS: 6.0000 | MEDICATED_PAD | Freq: Once | CUTANEOUS | Status: DC

## 2021-06-16 MED ORDER — ROCURONIUM BROMIDE 10 MG/ML (PF) SYRINGE
PREFILLED_SYRINGE | INTRAVENOUS | Status: DC | PRN
  Administered 2021-06-16: 5 mg via INTRAVENOUS
  Administered 2021-06-16: 45 mg via INTRAVENOUS

## 2021-06-16 MED ORDER — KETOROLAC TROMETHAMINE 30 MG/ML IJ SOLN
30.0000 mg | Freq: Once | INTRAMUSCULAR | Status: AC
  Administered 2021-06-16: 30 mg via INTRAVENOUS
  Filled 2021-06-16: qty 1

## 2021-06-16 MED ORDER — MIDAZOLAM HCL 2 MG/2ML IJ SOLN
INTRAMUSCULAR | Status: AC
  Filled 2021-06-16: qty 2

## 2021-06-16 MED ORDER — CEFAZOLIN IN SODIUM CHLORIDE 3-0.9 GM/100ML-% IV SOLN
3.0000 g | INTRAVENOUS | Status: AC
  Administered 2021-06-16: 3 g via INTRAVENOUS

## 2021-06-16 MED ORDER — ORAL CARE MOUTH RINSE: 15.0000 mL | Freq: Once | OROMUCOSAL | Status: AC

## 2021-06-16 MED ORDER — LACTATED RINGERS IV SOLN: INTRAVENOUS | Status: DC

## 2021-06-16 MED ORDER — CEFAZOLIN SODIUM-DEXTROSE 1-4 GM/50ML-% IV SOLN
INTRAVENOUS | Status: AC
  Filled 2021-06-16: qty 50

## 2021-06-16 MED ORDER — PROPOFOL 10 MG/ML IV BOLUS
INTRAVENOUS | Status: DC | PRN
Start: 2021-06-16 — End: 2021-06-16
  Administered 2021-06-16: 200 mg via INTRAVENOUS

## 2021-06-16 MED ORDER — DEXMEDETOMIDINE (PRECEDEX) IN NS 20 MCG/5ML (4 MCG/ML) IV SYRINGE
PREFILLED_SYRINGE | INTRAVENOUS | Status: DC | PRN
Start: 2021-06-16 — End: 2021-06-16
  Administered 2021-06-16: 8 ug via INTRAVENOUS
  Administered 2021-06-16: 4 ug via INTRAVENOUS

## 2021-06-16 SURGICAL SUPPLY — 39 items
ADH SKN CLS APL DERMABOND .7 (GAUZE/BANDAGES/DRESSINGS) ×1
APL PRP STRL LF DISP 70% ISPRP (MISCELLANEOUS) ×1
BLADE SURG SZ11 CARB STEEL (BLADE) ×2 IMPLANT
CHLORAPREP W/TINT 26 (MISCELLANEOUS) ×2 IMPLANT
CLOTH BEACON ORANGE TIMEOUT ST (SAFETY) ×2 IMPLANT
COVER LIGHT HANDLE STERIS (MISCELLANEOUS) ×4 IMPLANT
DERMABOND ADVANCED (GAUZE/BANDAGES/DRESSINGS) ×1
DERMABOND ADVANCED .7 DNX12 (GAUZE/BANDAGES/DRESSINGS) ×1 IMPLANT
ELECT REM PT RETURN 9FT ADLT (ELECTROSURGICAL) ×2
ELECTRODE REM PT RTRN 9FT ADLT (ELECTROSURGICAL) ×1 IMPLANT
GAUZE 4X4 16PLY ~~LOC~~+RFID DBL (SPONGE) ×2 IMPLANT
GLOVE SURG POLYISO LF SZ7.5 (GLOVE) ×2 IMPLANT
GLOVE SURG UNDER POLY LF SZ7 (GLOVE) ×4 IMPLANT
GOWN STRL REUS W/TWL LRG LVL3 (GOWN DISPOSABLE) ×6 IMPLANT
INST SET MINOR GENERAL (KITS) ×1 IMPLANT
KIT TURNOVER KIT A (KITS) ×2 IMPLANT
LIGASURE IMPACT 36 18CM CVD LR (INSTRUMENTS) ×1 IMPLANT
MANIFOLD NEPTUNE II (INSTRUMENTS) ×2 IMPLANT
MESH VENTRALEX ST 1-7/10 CRC S (Mesh General) ×1 IMPLANT
NDL HYPO 21X1.5 SAFETY (NEEDLE) ×1 IMPLANT
NEEDLE HYPO 21X1.5 SAFETY (NEEDLE) ×2 IMPLANT
NS IRRIG 1000ML POUR BTL (IV SOLUTION) ×2 IMPLANT
PACK MAJOR ABDOMINAL (CUSTOM PROCEDURE TRAY) IMPLANT
PACK MINOR (CUSTOM PROCEDURE TRAY) IMPLANT
PAD ARMBOARD 7.5X6 YLW CONV (MISCELLANEOUS) ×2 IMPLANT
SET BASIN LINEN APH (SET/KITS/TRAYS/PACK) ×2 IMPLANT
SUT ETHIBOND 0 MO6 C/R (SUTURE) IMPLANT
SUT ETHIBOND NAB MO 7 #0 18IN (SUTURE) ×1 IMPLANT
SUT MNCRL AB 4-0 PS2 18 (SUTURE) ×2 IMPLANT
SUT NOVA NAB GS-22 2 2-0 T-19 (SUTURE) IMPLANT
SUT PROLENE 0 CT 1 CR/8 (SUTURE) IMPLANT
SUT SILK 2 0 (SUTURE)
SUT SILK 2-0 18XBRD TIE 12 (SUTURE) IMPLANT
SUT VIC AB 2-0 CT1 27 (SUTURE) ×2
SUT VIC AB 2-0 CT1 TAPERPNT 27 (SUTURE) ×1 IMPLANT
SUT VIC AB 3-0 SH 27 (SUTURE) ×2
SUT VIC AB 3-0 SH 27X BRD (SUTURE) ×1 IMPLANT
SUT VICRYL AB 2 0 TIES (SUTURE) IMPLANT
SYR 20ML LL LF (SYRINGE) ×5 IMPLANT

## 2021-06-16 NOTE — Interval H&P Note (Signed)
History and Physical Interval Note:  06/16/2021 7:11 AM  Benjamin Farley  has presented today for surgery, with the diagnosis of Incisional hernia, without obstruction or gangrene.  The various methods of treatment have been discussed with the patient and family. After consideration of risks, benefits and other options for treatment, the patient has consented to  Procedure(s): HERNIA REPAIR INCISIONAL W/ MESH (N/A) as a surgical intervention.  The patient's history has been reviewed, patient examined, no change in status, stable for surgery.  I have reviewed the patient's chart and labs.  Questions were answered to the patient's satisfaction.     Franky Macho

## 2021-06-16 NOTE — Op Note (Signed)
Patient:  Benjamin Farley  DOB:  1980/05/01  MRN:  [id number]   Preop Diagnosis: Incisional hernia  Postop Diagnosis: Same  Procedure: Incisional herniorrhaphy with mesh  Surgeon: Aviva Signs, MD  Anes: General endotracheal  Indications: Patient is a 41 year old white male who presents with a periumbilical incisional hernia.  The risks and benefits of the procedure including bleeding, infection, mesh use, and the possibility of recurrence of the hernia were fully explained to the patient, who gave informed consent.  Procedure note: The patient was placed in the supine position.  After general anesthesia was administered, the abdomen was prepped and draped using the usual sterile technique with ChloraPrep.  Surgical site confirmation was performed.  An infraumbilical incision was made down to the fascia.  The umbilicus was freed away from the underlying fascia.  The patient had 2-1/2 cm hernia sac with omentum present.  The hernia sac was excised down to the fascia.  The excess omentum was excised using the LigaSure.  No bowel was present in the hernia sac.  I then palpated the underside of the anterior abdominal wall.  A 4.3 cm Bard Ventralax ST patch was then inserted and secured to the fascia using 0 Ethibond interrupted sutures.  The overlying fascia was reapproximated transversely using 0 Ethibond interrupted sutures.  The base of the umbilicus was secured back to the fascia using a 2-0 Vicryl interrupted suture.  The subcutaneous layer was reapproximated using a 3-0 Vicryl interrupted suture.  Exparel was instilled into the surrounding wound.  The skin was closed using a 4-0 Monocryl subcuticular suture.  Dermabond was applied.  All tape and needle counts were correct at the end of the procedure.  The patient was awakened and transferred to PACU in stable condition.  Complications: None  EBL: Minimal  Specimen: None

## 2021-06-16 NOTE — Anesthesia Postprocedure Evaluation (Signed)
Anesthesia Post Note  Patient: Benjamin Farley  Procedure(s) Performed: HERNIA REPAIR INCISIONAL W/ MESH (Abdomen)  Patient location during evaluation: Phase II Anesthesia Type: General Level of consciousness: awake and alert and oriented Pain management: pain level controlled Vital Signs Assessment: post-procedure vital signs reviewed and stable Respiratory status: spontaneous breathing, nonlabored ventilation and respiratory function stable Cardiovascular status: blood pressure returned to baseline and stable Postop Assessment: no apparent nausea or vomiting Anesthetic complications: no   No notable events documented.   Last Vitals:  Vitals:   06/16/21 0830 06/16/21 0848  BP: 126/74 (!) 145/87  Pulse:  80  Resp: 17 18  Temp:    SpO2: 96% 98%    Last Pain:  Vitals:   06/16/21 0848  TempSrc:   PainSc: 0-No pain                 [name]

## 2021-06-16 NOTE — Transfer of Care (Signed)
Immediate Anesthesia Transfer of Care Note  Patient: [name]  Procedure(s) Performed: HERNIA REPAIR INCISIONAL W/ MESH (Abdomen)  Patient Location: PACU  Anesthesia Type:General  Level of Consciousness: awake, alert , oriented and patient cooperative  Airway & Oxygen Therapy: Patient Spontanous Breathing and Patient connected to face mask oxygen  Post-op Assessment: Report given to RN, Post -op Vital signs reviewed and stable and Patient moving all extremities  Post vital signs: Reviewed  Last Vitals:  Vitals Value Taken Time  BP    Temp    Pulse 75 06/16/21 0816  Resp 17 06/16/21 0816  SpO2 100 % 06/16/21 0816  Vitals shown include unvalidated device data.  Last Pain:  Vitals:   06/16/21 0653  TempSrc: Oral  PainSc: 0-No pain      Patients Stated Pain Goal: 6 (06/16/21 9458)  Complications: No notable events documented.

## 2021-06-16 NOTE — Anesthesia Procedure Notes (Signed)
Procedure Name: Intubation Date/Time: 06/16/2021 7:33 AM Performed by: Myna Bright, CRNA Pre-anesthesia Checklist: Patient identified, Emergency Drugs available, Suction available and Patient being monitored Patient Re-evaluated:Patient Re-evaluated prior to induction Oxygen Delivery Method: Circle system utilized Preoxygenation: Pre-oxygenation with 100% oxygen Induction Type: IV induction Ventilation: Mask ventilation with difficulty Laryngoscope Size: Mac and 4 Grade View: Grade II Tube type: Oral Tube size: 7.5 mm Number of attempts: 1 Airway Equipment and Method: Stylet Placement Confirmation: ETT inserted through vocal cords under direct vision, positive ETCO2 and breath sounds checked- equal and bilateral Secured at: 22 cm Tube secured with: Tape Dental Injury: Teeth and Oropharynx as per pre-operative assessment

## 2021-06-16 NOTE — Anesthesia Preprocedure Evaluation (Signed)
Anesthesia Evaluation  Patient identified by MRN, date of birth, ID band Patient awake    Reviewed: Allergy & Precautions, NPO status , Patient's Chart, lab work & pertinent test results  Airway Mallampati: II  TM Distance: >3 FB Neck ROM: Full    Dental  (+) Dental Advisory Given, Teeth Intact   Pulmonary neg pulmonary ROS,    Pulmonary exam normal breath sounds clear to auscultation       Cardiovascular Exercise Tolerance: Good hypertension, Pt. on medications Normal cardiovascular exam Rhythm:Regular Rate:Normal     Neuro/Psych negative neurological ROS  negative psych ROS   GI/Hepatic negative GI ROS, Neg liver ROS,   Endo/Other  Morbid obesity  Renal/GU negative Renal ROS  negative genitourinary   Musculoskeletal negative musculoskeletal ROS (+)   Abdominal   Peds negative pediatric ROS (+)  Hematology negative hematology ROS (+)   Anesthesia Other Findings   Reproductive/Obstetrics negative OB ROS                            Anesthesia Physical Anesthesia Plan  ASA: 3  Anesthesia Plan: General   Post-op Pain Management: Dilaudid IV   Induction: Intravenous  PONV Risk Score and Plan: Ondansetron and Dexamethasone  Airway Management Planned: Oral ETT  Additional Equipment:   Intra-op Plan:   Post-operative Plan: Extubation in OR  Informed Consent: I have reviewed the patients History and Physical, chart, labs and discussed the procedure including the risks, benefits and alternatives for the proposed anesthesia with the patient or authorized representative who has indicated his/her understanding and acceptance.     Dental advisory given  Plan Discussed with: CRNA and Surgeon  Anesthesia Plan Comments:         Anesthesia Quick Evaluation

## 2021-06-21 ENCOUNTER — Encounter (HOSPITAL_COMMUNITY): Payer: Self-pay | Admitting: General Surgery

## 2021-06-23 ENCOUNTER — Telehealth: Payer: Self-pay | Admitting: Family Medicine

## 2021-06-23 NOTE — Telephone Encounter (Signed)
Paperwork completed and faxed to [facility] at [phone] and received confirmation.  Out of work 06/13/2020 with a RTW date of 07/30/2021 without restrictions.

## 2021-06-29 ENCOUNTER — Encounter: Payer: Self-pay | Admitting: General Surgery

## 2021-06-29 ENCOUNTER — Ambulatory Visit (INDEPENDENT_AMBULATORY_CARE_PROVIDER_SITE_OTHER): Payer: BC Managed Care – PPO | Admitting: General Surgery

## 2021-06-29 ENCOUNTER — Other Ambulatory Visit: Payer: Self-pay

## 2021-06-29 VITALS — BP 148/86 | HR 94 | Temp 98.3°F | Resp 16 | Ht 73.0 in | Wt 369.0 lb

## 2021-06-29 DIAGNOSIS — Z09 Encounter for follow-up examination after completed treatment for conditions other than malignant neoplasm: Secondary | ICD-10-CM

## 2021-06-29 NOTE — Progress Notes (Signed)
Subjective:  ?  ? Benjamin Farley  ?Patient here for postoperative visit, status post incisional herniorrhaphy with mesh.  He states he is doing well and has minimal pain.  He has not been lifting anything over 20 pounds.  He denies any problems. ?Objective:  ? ? BP (!) 148/86   Pulse 94   Temp 98.3 ?F (36.8 ?C) (Other (Comment))   Resp 16   Ht 6\' 1"  (1.854 m)   Wt (!) 369 lb (167.4 kg)   SpO2 98%   BMI 48.68 kg/m?  ? ?General:  alert, cooperative, and no distress  ?Abdomen soft, incision healing well. ?   ? ?Assessment:  ? ? Doing well postoperatively.  ?  ?Plan:  ? ?May start increasing activity as able.  I told him that he could start increasing his lifting weight, though he needs to keep his back straight and use his thighs.  He should be able to return back to work without restrictions on 07/30/2021. ?

## 2021-07-13 DIAGNOSIS — Z6841 Body Mass Index (BMI) 40.0 and over, adult: Secondary | ICD-10-CM | POA: Diagnosis not present

## 2021-07-13 DIAGNOSIS — I10 Essential (primary) hypertension: Secondary | ICD-10-CM | POA: Diagnosis not present

## 2021-11-08 ENCOUNTER — Encounter: Payer: Self-pay | Admitting: Family Medicine

## 2021-11-08 ENCOUNTER — Ambulatory Visit (INDEPENDENT_AMBULATORY_CARE_PROVIDER_SITE_OTHER): Payer: BC Managed Care – PPO | Admitting: Family Medicine

## 2021-11-08 VITALS — BP 145/85 | HR 85 | Ht 73.0 in | Wt 371.2 lb

## 2021-11-08 DIAGNOSIS — Z1159 Encounter for screening for other viral diseases: Secondary | ICD-10-CM

## 2021-11-08 DIAGNOSIS — E785 Hyperlipidemia, unspecified: Secondary | ICD-10-CM

## 2021-11-08 DIAGNOSIS — Z0001 Encounter for general adult medical examination with abnormal findings: Secondary | ICD-10-CM

## 2021-11-08 DIAGNOSIS — I10 Essential (primary) hypertension: Secondary | ICD-10-CM | POA: Diagnosis not present

## 2021-11-08 DIAGNOSIS — R7301 Impaired fasting glucose: Secondary | ICD-10-CM

## 2021-11-08 DIAGNOSIS — R634 Abnormal weight loss: Secondary | ICD-10-CM

## 2021-11-08 DIAGNOSIS — Z114 Encounter for screening for human immunodeficiency virus [HIV]: Secondary | ICD-10-CM

## 2021-11-08 DIAGNOSIS — E559 Vitamin D deficiency, unspecified: Secondary | ICD-10-CM

## 2021-11-08 MED ORDER — LISINOPRIL-HYDROCHLOROTHIAZIDE 10-12.5 MG PO TABS: 1.0000 | ORAL_TABLET | Freq: Every day | ORAL | 3 refills | Status: DC

## 2021-11-08 MED ORDER — ROSUVASTATIN CALCIUM 10 MG PO TABS: 10.0000 mg | ORAL_TABLET | Freq: Every evening | ORAL | 2 refills | Status: DC

## 2021-11-08 NOTE — Progress Notes (Signed)
New Patient Office Visit  Subjective:  Patient ID: Benjamin Farley, male    DOB: 04-20-1981  Age: 41 y.o. MRN: [id number]  CC:  Chief Complaint  Patient presents with   New Patient (Initial Visit)    Establishing care, pt has bp concerns currently not taking medication that was given for this due to it causing swelling in his feet, also has weight concerns. Would like a general check up, transferring from [place] is a 41 y.o. male with past medical history of HTN presents for establishing care.  HTN: he was taking Amlodipine 5 mg and Hydrochlorothiazide 12.61m but stopped treatment a month ago due to swelling in his hands and feet.  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   Past Medical History:  Diagnosis Date   HTN (hypertension)     Past Surgical History:  Procedure Laterality Date   CHOLECYSTECTOMY     INCISIONAL HERNIA REPAIR N/A 06/16/2021   Procedure: HERNIA REPAIR INCISIONAL W/ MESH;  Surgeon: JAviva Signs MD;  Location: [facility];  Service: General;  Laterality: N/A;    Family History  Problem Relation Age of Onset   COPD Father    Hypertension Father    CVA Maternal Grandmother    Hypertension Maternal Grandmother    COPD Maternal Grandfather    COPD Paternal Grandmother    Heart attack Paternal Grandfather    Hypertension Paternal Grandfather     Social History   Socioeconomic History   Marital status: Married    Spouse name: Not on file   Number of children: Not on file   Years of education: Not on file   Highest education level: Not on file  Occupational History   Not on file  Tobacco Use   Smoking status: Never   Smokeless tobacco: Never  Vaping Use   Vaping Use: Never used  Substance and Sexual Activity   Alcohol use: Yes    Comment: occ   Drug use: No   Sexual activity: Not on file  Other Topics Concern   Not on file  Social History Narrative   Not on file   Social Determinants  of Health   Financial Resource Strain: Not on file  Food Insecurity: Not on file  Transportation Needs: Not on file  Physical Activity: Not on file  Stress: Not on file  Social Connections: Not on file  Intimate Partner Violence: Not on file    ROS Review of Systems  Constitutional:  Negative for chills, fatigue and fever.  HENT:  Negative for rhinorrhea, sinus pressure and sinus pain.   Eyes:  Negative for photophobia, pain and redness.  Respiratory:  Negative for chest tightness and shortness of breath.   Cardiovascular:  Negative for chest pain and palpitations.  Gastrointestinal:  Negative for diarrhea, nausea and vomiting.  Endocrine: Negative for polydipsia, polyphagia and polyuria.  Genitourinary:  Negative for hematuria and urgency.  Musculoskeletal:  Negative for gait problem and neck pain.  Skin:  Negative for rash and wound.  Neurological:  Negative for weakness, numbness and headaches.  Hematological:  Does not bruise/bleed easily.  Psychiatric/Behavioral:  Negative for self-injury and suicidal ideas.     Objective:   Today's Vitals: BP (!) 145/85   Pulse 85   Ht 6' 1"  (1.854 m)   Wt (!) 371 lb 3.2 oz (168.4 kg)   SpO2 95%   BMI 48.97 kg/m   Physical Exam HENT:  Head: Normocephalic.     Right Ear: External ear normal.     Left Ear: External ear normal.     Nose: No congestion or rhinorrhea.     Mouth/Throat:     Mouth: Mucous membranes are moist.  Eyes:     Extraocular Movements: Extraocular movements intact.     Pupils: Pupils are equal, round, and reactive to light.  Cardiovascular:     Rate and Rhythm: Normal rate and regular rhythm.     Pulses: Normal pulses.     Heart sounds: Normal heart sounds.  Pulmonary:     Effort: Pulmonary effort is normal.     Breath sounds: Normal breath sounds.  Abdominal:     Palpations: Abdomen is soft.  Musculoskeletal:     Cervical back: No rigidity.     Right lower leg: No edema.     Left lower leg: No  edema.  Skin:    General: Skin is warm.  Neurological:     Mental Status: He is alert and oriented to person, place, and time.     Comments: Normal affect     Assessment & Plan:   Problem List Items Addressed This Visit       Cardiovascular and Mediastinum   Essential hypertension - Primary    Uncontrolled  He was taking Amlodipine 5 mg and Hydrochlorothiazide 12.27m but stopped treatment a month ago due to swelling in his hands and feet Will d/c amlodipine and hydrochlorothiazide and start patient on a combination medicine Will start patient on lisinopril-hydrochlorothiazide 10-12.5 Dash diet reviewed  Will reassess BP in 1 month       Relevant Medications   rosuvastatin (CRESTOR) 10 MG tablet   lisinopril-hydrochlorothiazide (ZESTORETIC) 10-12.5 MG tablet     Other   Weight loss    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 recommended eating healthier snacks while at work recommended increasing physical activities      Other Visit Diagnoses     Dyslipidemia       Relevant Medications   rosuvastatin (CRESTOR) 10 MG tablet   Need for hepatitis C screening test       Relevant Orders   Hepatitis C Antibody   Encounter for screening for HIV       Relevant Orders   HIV antibody (with reflex)   Vitamin D deficiency       Relevant Orders   Vitamin D (25 hydroxy)   Encounter for general adult medical examination with abnormal findings       Relevant Orders   CBC with Differential/Platelet   TSH + free T4   Lipid panel   CMP14+EGFR   IFG (impaired fasting glucose)       Relevant Orders   Hemoglobin A1C       Outpatient Encounter Medications as of 11/08/2021  Medication Sig   lisinopril-hydrochlorothiazide (ZESTORETIC) 10-12.5 MG tablet Take 1 tablet by mouth daily.   amLODipine (NORVASC) 5 MG tablet Take 5 mg by mouth every evening. (Patient not taking: Reported on 11/08/2021)    HYDROcodone-acetaminophen (NORCO) 5-325 MG tablet Take 1 tablet by mouth every 4 (four) hours as needed for moderate pain. (Patient not taking: Reported on 06/29/2021)   rosuvastatin (CRESTOR) 10 MG tablet Take 1 tablet (10 mg total) by mouth every evening.   [DISCONTINUED] hydrochlorothiazide (MICROZIDE) 12.5 MG capsule Take 12.5 mg by mouth every evening. (Patient not taking: Reported on 11/08/2021)   [DISCONTINUED] rosuvastatin (CRESTOR) 10  MG tablet Take 10 mg by mouth every evening. (Patient not taking: Reported on 11/08/2021)   No facility-administered encounter medications on file as of 11/08/2021.    Follow-up: Return in about 1 year (around 11/09/2022) for BP.   Alvira Monday, FNP

## 2021-11-08 NOTE — Assessment & Plan Note (Signed)
Uncontrolled  He was taking Amlodipine 5 mg and Hydrochlorothiazide 12.5mg  but stopped treatment a month ago due to swelling in his hands and feet Will d/c amlodipine and hydrochlorothiazide and start patient on a combination medicine Will start patient on lisinopril-hydrochlorothiazide 10-12.5 Dash diet reviewed  Will reassess BP in 1 month

## 2021-11-08 NOTE — Patient Instructions (Addendum)
I appreciate the opportunity to provide care to you today!    Follow up:  1 months for BP  Labs: please stop by the lab during the week to get your blood drawn (CBC, CMP, TSH, Lipid profile, HgA1c, Vit D)  Screening: HIV and Hep C  Please pick up your medication at the pharmacy   Please continue to a heart-healthy diet and increase your physical activities. Try to exercise for at least three times a week.      It was a pleasure to see you and I look forward to continuing to work together on your health and well-being. Please do not hesitate to call the office if you need care or have questions about your care.   Have a wonderful day and week. With Gratitude, Gilmore Laroche MSN, FNP-BC

## 2021-11-08 NOTE — Assessment & Plan Note (Signed)
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

## 2021-12-14 ENCOUNTER — Ambulatory Visit: Payer: BC Managed Care – PPO | Admitting: Family Medicine

## 2021-12-21 ENCOUNTER — Encounter: Payer: Self-pay | Admitting: Family Medicine

## 2021-12-21 ENCOUNTER — Ambulatory Visit (INDEPENDENT_AMBULATORY_CARE_PROVIDER_SITE_OTHER): Payer: BC Managed Care – PPO | Admitting: Family Medicine

## 2021-12-21 VITALS — BP 132/83 | HR 90 | Ht 74.0 in | Wt 364.0 lb

## 2021-12-21 DIAGNOSIS — Z0001 Encounter for general adult medical examination with abnormal findings: Secondary | ICD-10-CM | POA: Diagnosis not present

## 2021-12-21 DIAGNOSIS — R7301 Impaired fasting glucose: Secondary | ICD-10-CM | POA: Diagnosis not present

## 2021-12-21 DIAGNOSIS — Z114 Encounter for screening for human immunodeficiency virus [HIV]: Secondary | ICD-10-CM | POA: Diagnosis not present

## 2021-12-21 DIAGNOSIS — I10 Essential (primary) hypertension: Secondary | ICD-10-CM

## 2021-12-21 DIAGNOSIS — E559 Vitamin D deficiency, unspecified: Secondary | ICD-10-CM | POA: Diagnosis not present

## 2021-12-21 DIAGNOSIS — Z23 Encounter for immunization: Secondary | ICD-10-CM

## 2021-12-21 DIAGNOSIS — Z1159 Encounter for screening for other viral diseases: Secondary | ICD-10-CM | POA: Diagnosis not present

## 2021-12-21 NOTE — Patient Instructions (Signed)
I appreciate the opportunity to provide care to you today!    Follow up:  3 months  Labs: please stop by the lab today to get your blood drawn    Keep up the good work!!!   Please continue to a heart-healthy diet and increase your physical activities. Try to exercise for at least three times a week.      It was a pleasure to see you and I look forward to continuing to work together on your health and well-being. Please do not hesitate to call the office if you need care or have questions about your care.   Have a wonderful day and week. With Gratitude, Gilmore Laroche MSN, FNP-BC

## 2021-12-21 NOTE — Assessment & Plan Note (Addendum)
Controlled Reports adherence to treatment regimen He denies headaches, dizziness, and blurred vision No changes to treatment regimen Encouraged to continue taking lisinopril-Hydrochlorothiazide 10-12.5

## 2021-12-21 NOTE — Progress Notes (Signed)
Established Patient Office Visit  Subjective:  Patient ID: Benjamin Farley, male    DOB: 03-20-1981  Age: 41 y.o. MRN: [id number]  CC:  Chief Complaint  Patient presents with   Follow-up    Pt states bp medication has been helping, no swelling.     HPI Benjamin Farley is a 41 y.o. male with past medical history of Essential Hypertension presents for f/u of  chronic medical conditions. HTN: Controlled. Reports adherence to treatment regimen. He denies headaches, dizziness and blurred vision  He received his tdap vaccine today.  Past Medical History:  Diagnosis Date   HTN (hypertension)     Past Surgical History:  Procedure Laterality Date   CHOLECYSTECTOMY     INCISIONAL HERNIA REPAIR N/A 06/16/2021   Procedure: HERNIA REPAIR INCISIONAL W/ MESH;  Surgeon: Franky Macho, MD;  Location: [facility];  Service: General;  Laterality: N/A;    Family History  Problem Relation Age of Onset   COPD Father    Hypertension Father    CVA Maternal Grandmother    Hypertension Maternal Grandmother    COPD Maternal Grandfather    COPD Paternal Grandmother    Heart attack Paternal Grandfather    Hypertension Paternal Grandfather     Social History   Socioeconomic History   Marital status: Married    Spouse name: Not on file   Number of children: Not on file   Years of education: Not on file   Highest education level: Not on file  Occupational History   Not on file  Tobacco Use   Smoking status: Never   Smokeless tobacco: Never  Vaping Use   Vaping Use: Never used  Substance and Sexual Activity   Alcohol use: Yes    Comment: occ   Drug use: No   Sexual activity: Not on file  Other Topics Concern   Not on file  Social History Narrative   Not on file   Social Determinants of Health   Financial Resource Strain: Not on file  Food Insecurity: Not on file  Transportation Needs: Not on file  Physical Activity: Not on file  Stress: Not on file  Social Connections: Not on file   Intimate Partner Violence: Not on file    Outpatient Medications Prior to Visit  Medication Sig Dispense Refill   lisinopril-hydrochlorothiazide (ZESTORETIC) 10-12.5 MG tablet Take 1 tablet by mouth daily. 90 tablet 3   rosuvastatin (CRESTOR) 10 MG tablet Take 1 tablet (10 mg total) by mouth every evening. 30 tablet 2   amLODipine (NORVASC) 5 MG tablet Take 5 mg by mouth every evening.     HYDROcodone-acetaminophen (NORCO) 5-325 MG tablet Take 1 tablet by mouth every 4 (four) hours as needed for moderate pain. (Patient not taking: Reported on 06/29/2021) 30 tablet 0   No facility-administered medications prior to visit.    No Known Allergies  ROS Review of Systems  Constitutional:  Negative for chills, fatigue and fever.  Respiratory:  Negative for chest tightness and shortness of breath.   Cardiovascular:  Negative for chest pain and palpitations.  Neurological:  Negative for dizziness, numbness and headaches.      Objective:    Physical Exam HENT:     Head: Normocephalic.  Cardiovascular:     Rate and Rhythm: Normal rate and regular rhythm.     Pulses: Normal pulses.     Heart sounds: Normal heart sounds.  Pulmonary:     Effort: Pulmonary effort is normal.  Breath sounds: Normal breath sounds.  Musculoskeletal:     Right lower leg: No edema.     Left lower leg: No edema.  Neurological:     Mental Status: He is alert.     BP 132/83   Pulse 90   Ht 6\' 2"  (1.88 m)   Wt (!) 364 lb 0.6 oz (165.1 kg)   SpO2 96%   BMI 46.74 kg/m  Wt Readings from Last 3 Encounters:  12/21/21 (!) 364 lb 0.6 oz (165.1 kg)  11/08/21 (!) 371 lb 3.2 oz (168.4 kg)  06/29/21 (!) 369 lb (167.4 kg)    No results found for: "TSH" Lab Results  Component Value Date   WBC 8.2 02/14/2010   HGB 13.9 02/14/2010   HCT 40.4 02/14/2010   MCV 87.7 02/14/2010   PLT 200 02/14/2010   Lab Results  Component Value Date   NA 137 06/14/2021   K 3.7 06/14/2021   CO2 21 (L) 06/14/2021    GLUCOSE 101 (H) 06/14/2021   BUN 18 06/14/2021   CREATININE 0.92 06/14/2021   BILITOT 0.4 02/14/2010   ALKPHOS 56 02/14/2010   AST 33 02/14/2010   ALT 34 02/14/2010   PROT 6.5 02/14/2010   ALBUMIN 3.8 02/14/2010   CALCIUM 8.8 (L) 06/14/2021   ANIONGAP 12 06/14/2021   No results found for: "CHOL" No results found for: "HDL" No results found for: "LDLCALC" No results found for: "TRIG" No results found for: "CHOLHDL" No results found for: "HGBA1C"    Assessment & Plan:   Problem List Items Addressed This Visit       Cardiovascular and Mediastinum   Essential hypertension - Primary    Controlled Reports adherence to treatment regimen He denies headaches, dizziness, and blurred vision No changes to treatment regimen Encouraged to continue taking lisinopril-Hydrochlorothiazide 10-12.5      Other Visit Diagnoses     Immunization due       Relevant Orders   Tdap vaccine greater than or equal to 7yo IM       No orders of the defined types were placed in this encounter.   Follow-up: Return in about 3 months (around 03/23/2022).    03/25/2022, FNP

## 2021-12-22 LAB — CBC WITH DIFFERENTIAL/PLATELET
Basophils Absolute: 0.1 10*3/uL (ref 0.0–0.2)
Basos: 1 %
EOS (ABSOLUTE): 0.1 10*3/uL (ref 0.0–0.4)
Eos: 1 %
Hematocrit: 43.9 % (ref 37.5–51.0)
Hemoglobin: 14.7 g/dL (ref 13.0–17.7)
Immature Grans (Abs): 0.1 10*3/uL (ref 0.0–0.1)
Immature Granulocytes: 1 %
Lymphocytes Absolute: 1.6 10*3/uL (ref 0.7–3.1)
Lymphs: 17 %
MCH: 29.4 pg (ref 26.6–33.0)
MCHC: 33.5 g/dL (ref 31.5–35.7)
MCV: 88 fL (ref 79–97)
Monocytes Absolute: 0.5 10*3/uL (ref 0.1–0.9)
Monocytes: 5 %
Neutrophils Absolute: 7.1 10*3/uL — ABNORMAL HIGH (ref 1.4–7.0)
Neutrophils: 75 %
Platelets: 297 10*3/uL (ref 150–450)
RBC: 5 x10E6/uL (ref 4.14–5.80)
RDW: 13.3 % (ref 11.6–15.4)
WBC: 9.3 10*3/uL (ref 3.4–10.8)

## 2021-12-22 LAB — CMP14+EGFR
ALT: 28 IU/L (ref 0–44)
AST: 20 IU/L (ref 0–40)
Albumin/Globulin Ratio: 1.6 (ref 1.2–2.2)
Albumin: 4.5 g/dL (ref 4.1–5.1)
Alkaline Phosphatase: 114 IU/L (ref 44–121)
BUN/Creatinine Ratio: 22 — ABNORMAL HIGH (ref 9–20)
BUN: 22 mg/dL (ref 6–24)
Bilirubin Total: 0.3 mg/dL (ref 0.0–1.2)
CO2: 20 mmol/L (ref 20–29)
Calcium: 9.7 mg/dL (ref 8.7–10.2)
Chloride: 100 mmol/L (ref 96–106)
Creatinine, Ser: 0.98 mg/dL (ref 0.76–1.27)
Globulin, Total: 2.8 g/dL (ref 1.5–4.5)
Glucose: 93 mg/dL (ref 70–99)
Potassium: 4.6 mmol/L (ref 3.5–5.2)
Sodium: 139 mmol/L (ref 134–144)
Total Protein: 7.3 g/dL (ref 6.0–8.5)
eGFR: 99 mL/min/{1.73_m2} (ref >59)

## 2021-12-22 LAB — LIPID PANEL
Chol/HDL Ratio: 3.3 ratio (ref 0.0–5.0)
Cholesterol, Total: 146 mg/dL (ref 100–199)
HDL: 44 mg/dL (ref >39)
LDL Chol Calc (NIH): 85 mg/dL (ref 0–99)
Triglycerides: 89 mg/dL (ref 0–149)
VLDL Cholesterol Cal: 17 mg/dL (ref 5–40)

## 2021-12-22 LAB — HIV ANTIBODY (ROUTINE TESTING W REFLEX): HIV Screen 4th Generation wRfx: NONREACTIVE

## 2021-12-22 LAB — TSH+FREE T4
Free T4: 1.29 ng/dL (ref 0.82–1.77)
TSH: 2.94 u[IU]/mL (ref 0.450–4.500)

## 2021-12-22 LAB — VITAMIN D 25 HYDROXY (VIT D DEFICIENCY, FRACTURES): Vit D, 25-Hydroxy: 12.7 ng/mL — ABNORMAL LOW (ref 30.0–100.0)

## 2021-12-22 LAB — HEMOGLOBIN A1C
Est. average glucose Bld gHb Est-mCnc: 120 mg/dL
Hgb A1c MFr Bld: 5.8 % — ABNORMAL HIGH (ref 4.8–5.6)

## 2021-12-22 LAB — HEPATITIS C ANTIBODY: Hep C Virus Ab: NONREACTIVE

## 2021-12-26 ENCOUNTER — Other Ambulatory Visit: Payer: Self-pay | Admitting: Family Medicine

## 2021-12-26 DIAGNOSIS — E559 Vitamin D deficiency, unspecified: Secondary | ICD-10-CM

## 2021-12-26 MED ORDER — VITAMIN D (ERGOCALCIFEROL) 1.25 MG (50000 UNIT) PO CAPS: 50000.0000 [IU] | ORAL_CAPSULE | ORAL | 2 refills | Status: DC

## 2021-12-26 NOTE — Progress Notes (Signed)
Please inform the patient that he has prediabetes and his vitamin D is low. A prescription for Vit D supplement has been sent to his pharmacy.  I recommend increasing his physical activities and decreasing added sugars to his diet.

## 2022-01-08 ENCOUNTER — Other Ambulatory Visit: Payer: Self-pay | Admitting: Family Medicine

## 2022-01-08 DIAGNOSIS — E785 Hyperlipidemia, unspecified: Secondary | ICD-10-CM

## 2022-03-29 ENCOUNTER — Ambulatory Visit: Payer: BC Managed Care – PPO | Admitting: Family Medicine

## 2022-04-03 ENCOUNTER — Other Ambulatory Visit: Payer: Self-pay | Admitting: Family Medicine

## 2022-04-03 DIAGNOSIS — E559 Vitamin D deficiency, unspecified: Secondary | ICD-10-CM

## 2022-04-13 ENCOUNTER — Ambulatory Visit (INDEPENDENT_AMBULATORY_CARE_PROVIDER_SITE_OTHER): Payer: BC Managed Care – PPO | Admitting: Family Medicine

## 2022-04-13 ENCOUNTER — Encounter: Payer: Self-pay | Admitting: Family Medicine

## 2022-04-13 VITALS — BP 136/82 | HR 92 | Ht 73.0 in | Wt 371.1 lb

## 2022-04-13 DIAGNOSIS — E785 Hyperlipidemia, unspecified: Secondary | ICD-10-CM

## 2022-04-13 DIAGNOSIS — R7303 Prediabetes: Secondary | ICD-10-CM | POA: Diagnosis not present

## 2022-04-13 DIAGNOSIS — S29011A Strain of muscle and tendon of front wall of thorax, initial encounter: Secondary | ICD-10-CM | POA: Diagnosis not present

## 2022-04-13 DIAGNOSIS — I10 Essential (primary) hypertension: Secondary | ICD-10-CM | POA: Diagnosis not present

## 2022-04-13 DIAGNOSIS — E559 Vitamin D deficiency, unspecified: Secondary | ICD-10-CM | POA: Diagnosis not present

## 2022-04-13 DIAGNOSIS — E038 Other specified hypothyroidism: Secondary | ICD-10-CM

## 2022-04-13 DIAGNOSIS — E7849 Other hyperlipidemia: Secondary | ICD-10-CM

## 2022-04-13 MED ORDER — CYCLOBENZAPRINE HCL 5 MG PO TABS: 5.0000 mg | ORAL_TABLET | Freq: Three times a day (TID) | ORAL | 1 refills | Status: DC | PRN

## 2022-04-13 MED ORDER — ROSUVASTATIN CALCIUM 10 MG PO TABS: ORAL_TABLET | ORAL | 2 refills | Status: DC

## 2022-04-13 MED ORDER — LISINOPRIL-HYDROCHLOROTHIAZIDE 10-12.5 MG PO TABS: 1.0000 | ORAL_TABLET | Freq: Every day | ORAL | 3 refills | Status: DC

## 2022-04-13 NOTE — Assessment & Plan Note (Signed)
He takes vitamin D weekly supplement Encouraged to continue on therapy Will assess his vitamin D levels today Last vitamin D Lab Results  Component Value Date   VD25OH 12.7 (L) 12/21/2021

## 2022-04-13 NOTE — Progress Notes (Signed)
Established Patient Office Visit  Subjective:  Patient ID: Benjamin Farley, male    DOB: Mar 03, 1981  Age: 41 y.o. MRN: [id number]  CC:  Chief Complaint  Patient presents with   Follow-up    3 month f/u on htn, has had good readings at home.    Breast Mass    Pt reports chest soreness since 04/30/2021.     HPI Benjamin Farley is a 41 y.o. male with past medical history of hypertension, hyperlipidemia, vitamin D deficiency presents for f/u of  chronic medical conditions. For the details of today's visit, please refer to the assessment and plan.     Past Medical History:  Diagnosis Date   HTN (hypertension)     Past Surgical History:  Procedure Laterality Date   CHOLECYSTECTOMY     INCISIONAL HERNIA REPAIR N/A 06/16/2021   Procedure: HERNIA REPAIR INCISIONAL W/ MESH;  Surgeon: Aviva Signs, MD;  Location: [facility];  Service: General;  Laterality: N/A;    Family History  Problem Relation Age of Onset   COPD Father    Hypertension Father    CVA Maternal Grandmother    Hypertension Maternal Grandmother    COPD Maternal Grandfather    COPD Paternal Grandmother    Heart attack Paternal Grandfather    Hypertension Paternal Grandfather     Social History   Socioeconomic History   Marital status: Married    Spouse name: Not on file   Number of children: Not on file   Years of education: Not on file   Highest education level: Not on file  Occupational History   Not on file  Tobacco Use   Smoking status: Never   Smokeless tobacco: Never  Vaping Use   Vaping Use: Never used  Substance and Sexual Activity   Alcohol use: Yes    Comment: occ   Drug use: No   Sexual activity: Not on file  Other Topics Concern   Not on file  Social History Narrative   Not on file   Social Determinants of Health   Financial Resource Strain: Not on file  Food Insecurity: Not on file  Transportation Needs: Not on file  Physical Activity: Not on file  Stress: Not on file  Social  Connections: Not on file  Intimate Partner Violence: Not on file    Outpatient Medications Prior to Visit  Medication Sig Dispense Refill   Vitamin D, Ergocalciferol, (DRISDOL) 1.25 MG (50000 UNIT) CAPS capsule TAKE 1 CAPSULE BY MOUTH EVERY 7 DAYS 5 capsule 2   lisinopril-hydrochlorothiazide (ZESTORETIC) 10-12.5 MG tablet Take 1 tablet by mouth daily. 90 tablet 3   rosuvastatin (CRESTOR) 10 MG tablet TAKE 1 TABLET(10 MG) BY MOUTH EVERY EVENING 30 tablet 2   No facility-administered medications prior to visit.    No Known Allergies  ROS Review of Systems  Constitutional:  Negative for fatigue and fever.  Eyes:  Negative for visual disturbance.  Respiratory:  Negative for chest tightness and shortness of breath.   Cardiovascular:  Negative for chest pain and palpitations.  Musculoskeletal:        Right chest soreness  Neurological:  Negative for dizziness and headaches.      Objective:    Physical Exam HENT:     Head: Normocephalic.     Right Ear: External ear normal.     Left Ear: External ear normal.  Cardiovascular:     Rate and Rhythm: Regular rhythm.     Heart sounds: No murmur heard.  Pulmonary:     Effort: No respiratory distress.     Breath sounds: Normal breath sounds.  Musculoskeletal:     Comments: Pain with palpation of the right chest at the nipple line No mass or lump palpated No changes in skin color   Neurological:     Mental Status: He is alert.     BP 136/82   Pulse 92   Ht _0  (1.854 m)   Wt (!) 371 lb 1.3 oz (168.3 kg)   SpO2 96%   BMI 48.96 kg/m  Wt Readings from Last 3 Encounters:  04/13/22 (!) 371 lb 1.3 oz (168.3 kg)  12/21/21 (!) 364 lb 0.6 oz (165.1 kg)  11/08/21 (!) 371 lb 3.2 oz (168.4 kg)    Lab Results  Component Value Date   TSH 2.940 12/21/2021   Lab Results  Component Value Date   WBC 9.3 12/21/2021   HGB 14.7 12/21/2021   HCT 43.9 12/21/2021   MCV 88 12/21/2021   PLT 297 12/21/2021   Lab Results  Component  Value Date   NA 139 12/21/2021   K 4.6 12/21/2021   CO2 20 12/21/2021   GLUCOSE 93 12/21/2021   BUN 22 12/21/2021   CREATININE 0.98 12/21/2021   BILITOT 0.3 12/21/2021   ALKPHOS 114 12/21/2021   AST 20 12/21/2021   ALT 28 12/21/2021   PROT 7.3 12/21/2021   ALBUMIN 4.5 12/21/2021   CALCIUM 9.7 12/21/2021   ANIONGAP 12 06/14/2021   EGFR 99 12/21/2021   Lab Results  Component Value Date   CHOL 146 12/21/2021   Lab Results  Component Value Date   HDL 44 12/21/2021   Lab Results  Component Value Date   LDLCALC 85 12/21/2021   Lab Results  Component Value Date   TRIG 89 12/21/2021   Lab Results  Component Value Date   CHOLHDL 3.3 12/21/2021   Lab Results  Component Value Date   HGBA1C 5.8 (H) 12/21/2021      Assessment & Plan:  Essential hypertension Assessment & Plan: Controlled He takes lisinopril hydrochlorothiazide 10-12.5 daily He denies headaches, visual disturbances, and shortness of breath Will assess CBC and CMP today BP Readings from Last 3 Encounters:  04/13/22 136/82  12/21/21 132/83  11/08/21 (!) 145/85     Orders: -     Lisinopril-hydroCHLOROthiazide; Take 1 tablet by mouth daily.  Dispense: 90 tablet; Refill: 3 -     CMP14+EGFR -     CBC with Differential/Platelet  Muscle strain of anterior chest wall Assessment & Plan: He complains of right non-radiating chest wall pain at the nipple line He reports repetitive motion of lifting and pulling at his work He complains of pain only with palpation No treatment has been reported since onset of symptoms Encourage patient to take ibuprofen and heat applications to the affected site Flexeril order and encouraged patient to take at bedtime Encouraged to avoid activities that aggravate his symptoms   Muscle strain of chest wall, initial encounter -     Cyclobenzaprine HCl; Take 1 tablet (5 mg total) by mouth 3 (three) times daily as needed for muscle spasms.  Dispense: 30 tablet; Refill:  1  Prediabetes -     Hemoglobin A1c  Vitamin D deficiency Assessment & Plan: He takes vitamin D weekly supplement Encouraged to continue on therapy Will assess his vitamin D levels today Last vitamin D Lab Results  Component Value Date   VD25OH 12.7 (L) 12/21/2021     Orders: -  VITAMIN D 25 Hydroxy (Vit-D Deficiency, Fractures)  Other hyperlipidemia Assessment & Plan: He takes rosuvastatin 10 mg daily No muscle aches and pain reported Encouraged to continue on therapy Will assess lipid panel today Lab Results  Component Value Date   CHOL 146 12/21/2021   HDL 44 12/21/2021   LDLCALC 85 12/21/2021   TRIG 89 12/21/2021   CHOLHDL 3.3 12/21/2021     Orders: -     Lipid panel  Dyslipidemia -     Rosuvastatin Calcium; TAKE 1 TABLET(10 MG) BY MOUTH EVERY EVENING  Dispense: 90 tablet; Refill: 2  Other specified hypothyroidism -     TSH + free T4    Follow-up: Return in about 3 months (around 07/13/2022).   Alvira Monday, FNP

## 2022-04-13 NOTE — Assessment & Plan Note (Signed)
He takes rosuvastatin 10 mg daily No muscle aches and pain reported Encouraged to continue on therapy Will assess lipid panel today Lab Results  Component Value Date   CHOL 146 12/21/2021   HDL 44 12/21/2021   LDLCALC 85 12/21/2021   TRIG 89 12/21/2021   CHOLHDL 3.3 12/21/2021

## 2022-04-13 NOTE — Assessment & Plan Note (Signed)
He complains of right non-radiating chest wall pain at the nipple line He reports repetitive motion of lifting and pulling at his work He complains of pain only with palpation No treatment has been reported since onset of symptoms Encourage patient to take ibuprofen and heat applications to the affected site Flexeril order and encouraged patient to take at bedtime Encouraged to avoid activities that aggravate his symptoms

## 2022-04-13 NOTE — Assessment & Plan Note (Signed)
Controlled He takes lisinopril hydrochlorothiazide 10-12.5 daily He denies headaches, visual disturbances, and shortness of breath Will assess CBC and CMP today BP Readings from Last 3 Encounters:  04/13/22 136/82  12/21/21 132/83  11/08/21 (!) 145/85

## 2022-04-13 NOTE — Patient Instructions (Signed)
I appreciate the opportunity to provide care to you today!    Follow up:  3 months  Fasting Labs: please stop by the lab  during the week to get your blood drawn (CBC, CMP, TSH, Lipid profile, HgA1c, Vit D)  Please pick up your medications at the pharmacy   Please continue to a heart-healthy diet and increase your physical activities. Try to exercise for at least three times a week.      It was a pleasure to see you and I look forward to continuing to work together on your health and well-being. Please do not hesitate to call the office if you need care or have questions about your care.   Have a wonderful day and week. With Gratitude, Gilmore Laroche MSN, FNP-BC

## 2022-07-13 ENCOUNTER — Ambulatory Visit: Payer: BC Managed Care – PPO | Admitting: Family Medicine

## 2022-08-13 ENCOUNTER — Other Ambulatory Visit: Payer: Self-pay | Admitting: Family Medicine

## 2022-08-13 DIAGNOSIS — E559 Vitamin D deficiency, unspecified: Secondary | ICD-10-CM

## 2022-08-13 MED ORDER — VITAMIN D (ERGOCALCIFEROL) 1.25 MG (50000 UNIT) PO CAPS: 50000.0000 [IU] | ORAL_CAPSULE | ORAL | 1 refills | Status: DC

## 2022-11-08 DIAGNOSIS — Z0001 Encounter for general adult medical examination with abnormal findings: Secondary | ICD-10-CM | POA: Diagnosis not present

## 2022-11-08 DIAGNOSIS — E538 Deficiency of other specified B group vitamins: Secondary | ICD-10-CM | POA: Diagnosis not present

## 2022-11-08 DIAGNOSIS — Z1331 Encounter for screening for depression: Secondary | ICD-10-CM | POA: Diagnosis not present

## 2022-11-08 DIAGNOSIS — R61 Generalized hyperhidrosis: Secondary | ICD-10-CM | POA: Diagnosis not present

## 2022-11-08 DIAGNOSIS — Z6841 Body Mass Index (BMI) 40.0 and over, adult: Secondary | ICD-10-CM | POA: Diagnosis not present

## 2022-11-08 DIAGNOSIS — R5383 Other fatigue: Secondary | ICD-10-CM | POA: Diagnosis not present

## 2022-11-08 DIAGNOSIS — E559 Vitamin D deficiency, unspecified: Secondary | ICD-10-CM | POA: Diagnosis not present

## 2022-11-08 DIAGNOSIS — I10 Essential (primary) hypertension: Secondary | ICD-10-CM | POA: Diagnosis not present

## 2022-11-10 ENCOUNTER — Other Ambulatory Visit: Payer: Self-pay | Admitting: Family Medicine

## 2022-11-10 DIAGNOSIS — S29011A Strain of muscle and tendon of front wall of thorax, initial encounter: Secondary | ICD-10-CM

## 2022-11-20 DIAGNOSIS — E291 Testicular hypofunction: Secondary | ICD-10-CM | POA: Diagnosis not present

## 2022-12-24 DIAGNOSIS — E291 Testicular hypofunction: Secondary | ICD-10-CM | POA: Diagnosis not present

## 2022-12-28 ENCOUNTER — Other Ambulatory Visit: Payer: Self-pay | Admitting: Family Medicine

## 2022-12-28 DIAGNOSIS — I10 Essential (primary) hypertension: Secondary | ICD-10-CM

## 2023-01-28 DIAGNOSIS — E291 Testicular hypofunction: Secondary | ICD-10-CM | POA: Diagnosis not present

## 2023-03-04 DIAGNOSIS — E291 Testicular hypofunction: Secondary | ICD-10-CM | POA: Diagnosis not present

## 2023-03-26 ENCOUNTER — Other Ambulatory Visit: Payer: Self-pay | Admitting: Family Medicine

## 2023-03-26 DIAGNOSIS — E785 Hyperlipidemia, unspecified: Secondary | ICD-10-CM

## 2023-03-28 ENCOUNTER — Other Ambulatory Visit: Payer: Self-pay | Admitting: Family Medicine

## 2023-03-28 DIAGNOSIS — E785 Hyperlipidemia, unspecified: Secondary | ICD-10-CM

## 2023-04-04 ENCOUNTER — Ambulatory Visit: Payer: BC Managed Care – PPO | Admitting: Urology

## 2023-04-29 ENCOUNTER — Other Ambulatory Visit: Payer: Self-pay | Admitting: Family Medicine

## 2023-04-29 DIAGNOSIS — E785 Hyperlipidemia, unspecified: Secondary | ICD-10-CM

## 2023-05-26 ENCOUNTER — Other Ambulatory Visit: Payer: Self-pay | Admitting: Family Medicine

## 2023-05-26 DIAGNOSIS — E559 Vitamin D deficiency, unspecified: Secondary | ICD-10-CM

## 2023-05-30 DIAGNOSIS — R0981 Nasal congestion: Secondary | ICD-10-CM | POA: Diagnosis not present

## 2023-05-30 DIAGNOSIS — J342 Deviated nasal septum: Secondary | ICD-10-CM | POA: Diagnosis not present

## 2023-05-30 DIAGNOSIS — J343 Hypertrophy of nasal turbinates: Secondary | ICD-10-CM | POA: Diagnosis not present

## 2023-05-30 DIAGNOSIS — D2339 Other benign neoplasm of skin of other parts of face: Secondary | ICD-10-CM | POA: Diagnosis not present

## 2023-06-04 ENCOUNTER — Other Ambulatory Visit: Payer: Self-pay | Admitting: Family Medicine

## 2023-06-04 DIAGNOSIS — E785 Hyperlipidemia, unspecified: Secondary | ICD-10-CM

## 2023-07-10 ENCOUNTER — Other Ambulatory Visit: Payer: Self-pay | Admitting: Family Medicine

## 2023-07-10 DIAGNOSIS — E785 Hyperlipidemia, unspecified: Secondary | ICD-10-CM

## 2023-07-11 DIAGNOSIS — J343 Hypertrophy of nasal turbinates: Secondary | ICD-10-CM | POA: Diagnosis not present

## 2023-07-11 DIAGNOSIS — D2339 Other benign neoplasm of skin of other parts of face: Secondary | ICD-10-CM | POA: Diagnosis not present

## 2023-07-11 DIAGNOSIS — R0981 Nasal congestion: Secondary | ICD-10-CM | POA: Diagnosis not present

## 2023-07-11 DIAGNOSIS — J342 Deviated nasal septum: Secondary | ICD-10-CM | POA: Diagnosis not present

## 2023-07-25 ENCOUNTER — Telehealth: Payer: Self-pay

## 2023-07-25 ENCOUNTER — Ambulatory Visit: Payer: BC Managed Care – PPO | Admitting: Urology

## 2023-07-25 ENCOUNTER — Encounter: Payer: Self-pay | Admitting: Urology

## 2023-07-25 VITALS — BP 128/81 | HR 86

## 2023-07-25 DIAGNOSIS — E291 Testicular hypofunction: Secondary | ICD-10-CM

## 2023-07-25 DIAGNOSIS — N5201 Erectile dysfunction due to arterial insufficiency: Secondary | ICD-10-CM

## 2023-07-25 DIAGNOSIS — R350 Frequency of micturition: Secondary | ICD-10-CM | POA: Diagnosis not present

## 2023-07-25 MED ORDER — SILDENAFIL CITRATE 100 MG PO TABS: 100.0000 mg | ORAL_TABLET | Freq: Every day | ORAL | 11 refills | Status: AC | PRN

## 2023-07-25 MED ORDER — TADALAFIL 5 MG PO TABS: 5.0000 mg | ORAL_TABLET | Freq: Every day | ORAL | 11 refills | Status: AC | PRN

## 2023-07-25 NOTE — Telephone Encounter (Signed)
 Medication prior authorization request received.  Completed PA request through cover my meds for drug Tadalafil. KEY: [id number]  Approved: Pending

## 2023-07-25 NOTE — Telephone Encounter (Signed)
 Medication prior authorization request received.  Completed PA request through cover my meds for drug Sildenafil. KEY: [id number]  Approved: Yes

## 2023-07-25 NOTE — Progress Notes (Unsigned)
 Subjective: 1. Erectile dysfunction due to arterial insufficiency   2. Hypogonadism in male   3. Morbid obesity (HCC)   4. Urinary frequency      Consult requested by Dr. Elfredia Nevins.  07/25/23: Benjamin Farley is a 43 yo male who is sent for evaluation of erectile dysfunction that has been present for about a year.   He is able to get a partial erection that is difficulty to maintain.  He tried sildenafil 50mg  in the past without success.  He had no side effects with it.  He had a recent testosterone level of 202 and has been on TRT for about 6 months but he has missed the last 2 months. He has a good libido.  He has had some penile pain but no curvature.  The pain has resolved.  He may have had a penile injury but he is not sure.  He had no bruising.  He has HTN and hypercholesterolemia.  He has no history of smoking and only light alcohol use.  He has been losing weight but had gotten heavy while working 3rd shift.  His PSA was 1 on recent labs.    IPSS     Row Name 07/25/23 1400         International Prostate Symptom Score   How often have you had the sensation of not emptying your bladder? Not at All     How often have you had to urinate less than every two hours? More than half the time     How often have you found you stopped and started again several times when you urinated? Less than half the time     How often have you found it difficult to postpone urination? Less than 1 in 5 times     How often have you had a weak urinary stream? Less than 1 in 5 times     How often have you had to strain to start urination? Not at All     How many times did you typically get up at night to urinate? 2 Times     Total IPSS Score 10       Quality of Life due to urinary symptoms   If you were to spend the rest of your life with your urinary condition just the way it is now how would you feel about that? Mixed              ROS:  Review of Systems  HENT:  Positive for congestion.    Gastrointestinal:  Positive for heartburn.  Musculoskeletal:  Positive for back pain and joint pain.  All other systems reviewed and are negative.   No Known Allergies  Past Medical History:  Diagnosis Date   HTN (hypertension)     Past Surgical History:  Procedure Laterality Date   CHOLECYSTECTOMY     INCISIONAL HERNIA REPAIR N/A 06/16/2021   Procedure: HERNIA REPAIR INCISIONAL W/ MESH;  Surgeon: Franky Macho, MD;  Location: [facility];  Service: General;  Laterality: N/A;    Social History   Socioeconomic History   Marital status: Married    Spouse name: Not on file   Number of children: Not on file   Years of education: Not on file   Highest education level: Not on file  Occupational History   Not on file  Tobacco Use   Smoking status: Never   Smokeless tobacco: Never  Vaping Use   Vaping status: Never Used  Substance and Sexual Activity  Alcohol use: Yes    Comment: occ   Drug use: No   Sexual activity: Not on file  Other Topics Concern   Not on file  Social History Narrative   Not on file   Social Drivers of Health   Financial Resource Strain: Not on file  Food Insecurity: Not on file  Transportation Needs: Not on file  Physical Activity: Not on file  Stress: Not on file  Social Connections: Not on file  Intimate Partner Violence: Not on file    Family History  Problem Relation Age of Onset   COPD Father    Hypertension Father    CVA Maternal Grandmother    Hypertension Maternal Grandmother    COPD Maternal Grandfather    COPD Paternal Grandmother    Heart attack Paternal Grandfather    Hypertension Paternal Grandfather     Anti-infectives: Anti-infectives (From admission, onward)    None       Current Outpatient Medications  Medication Sig Dispense Refill   cyclobenzaprine (FLEXERIL) 5 MG tablet TAKE 1 TABLET(5 MG) BY MOUTH THREE TIMES DAILY AS NEEDED FOR MUSCLE SPASMS 30 tablet 1   lisinopril-hydrochlorothiazide (ZESTORETIC) 10-12.5  MG tablet TAKE 1 TABLET BY MOUTH DAILY 90 tablet 3   rosuvastatin (CRESTOR) 10 MG tablet TAKE 1 TABLET(10 MG) BY MOUTH EVERY EVENING 30 tablet 0   sildenafil (VIAGRA) 100 MG tablet Take 1 tablet (100 mg total) by mouth daily as needed for erectile dysfunction. Take 1/2 to 1 tab po with the daily tadalafil. 6 tablet 11   tadalafil (CIALIS) 5 MG tablet Take 1 tablet (5 mg total) by mouth daily as needed for erectile dysfunction. 30 tablet 11   Vitamin D, Ergocalciferol, (DRISDOL) 1.25 MG (50000 UNIT) CAPS capsule TAKE 1 CAPSULE BY MOUTH EVERY 7 DAYS. 20 capsule 1   No current facility-administered medications for this visit.     Objective: Vital signs in last 24 hours: BP 128/81   Pulse 86   Intake/Output from previous day: No intake/output data recorded. Intake/Output this shift: @IOTHISSHIFT @   Physical Exam Vitals reviewed.  Constitutional:      Appearance: Normal appearance. He is obese.  Cardiovascular:     Rate and Rhythm: Normal rate and regular rhythm.     Heart sounds: Normal heart sounds.  Pulmonary:     Effort: Pulmonary effort is normal. No respiratory distress.     Breath sounds: Normal breath sounds.  Abdominal:     Palpations: Abdomen is soft.     Hernia: No hernia is present.  Genitourinary:    Comments: Nl circ phallus with adequate meatus.  He has no plaques. Scrotum, testes and epididymis are normal.    Neurological:     Mental Status: He is alert.     Lab Results:  No results found for this or any previous visit (from the past 24 hours).  BMET No results for input(s): "NA", "K", "CL", "CO2", "GLUCOSE", "BUN", "CREATININE", "CALCIUM" in the last 72 hours. PT/INR No results for input(s): "LABPROT", "INR" in the last 72 hours. ABG No results for input(s): "PHART", "HCO3" in the last 72 hours.  Invalid input(s): "PCO2", "PO2" UA is clear.  Studies/Results: No results found. Records and labs from Dr. Sherwood Gambler reviewed.   Assessment/Plan: ED:   I  discussed options and will have him try daily tadalafil with sildenafil 50-100mg  in combination if needed.  Side effects and instructions reviewed.  Hypogonadism.  He has been off of the meds for a couple of  months.  I will get labs to reassess.  With his weight he may have an elevated estradiol.  LUT.  We will see if the tadalafil helps.    Meds ordered this encounter  Medications   sildenafil (VIAGRA) 100 MG tablet    Sig: Take 1 tablet (100 mg total) by mouth daily as needed for erectile dysfunction. Take 1/2 to 1 tab po with the daily tadalafil.    Dispense:  6 tablet    Refill:  11   tadalafil (CIALIS) 5 MG tablet    Sig: Take 1 tablet (5 mg total) by mouth daily as needed for erectile dysfunction.    Dispense:  30 tablet    Refill:  11     Orders Placed This Encounter  Procedures   Testosterone Free, Profile I    Standing Status:   Future    Expected Date:   08/01/2023    Expiration Date:   07/24/2024   Estradiol    Standing Status:   Future    Expected Date:   08/01/2023    Expiration Date:   07/24/2024   Follicle stimulating hormone    Standing Status:   Future    Expected Date:   08/01/2023    Expiration Date:   07/24/2024   Luteinizing hormone    Standing Status:   Future    Expected Date:   08/01/2023    Expiration Date:   07/24/2024     Return in about 8 weeks (around 09/19/2023).    CC: DrMarland Kitchen Elfredia Nevins.     Bjorn Pippin 07/26/2023

## 2023-07-26 ENCOUNTER — Other Ambulatory Visit

## 2023-07-26 DIAGNOSIS — E291 Testicular hypofunction: Secondary | ICD-10-CM | POA: Diagnosis not present

## 2023-07-27 LAB — TESTOSTERONE FREE, PROFILE I
Albumin: 4.2 g/dL (ref 4.1–5.1)
Sex Hormone Binding: 18.1 nmol/L (ref 16.5–55.9)
Testost., Free, Calc: 74.2 pg/mL (ref 30.3–183.2)
Testosterone: 277 ng/dL (ref 264–916)

## 2023-07-27 LAB — FOLLICLE STIMULATING HORMONE: FSH: 3.8 m[IU]/mL (ref 1.5–12.4)

## 2023-07-27 LAB — ESTRADIOL: Estradiol: 50.1 pg/mL — ABNORMAL HIGH (ref 7.6–42.6)

## 2023-07-27 LAB — LUTEINIZING HORMONE: LH: 5.7 m[IU]/mL (ref 1.7–8.6)

## 2023-08-08 ENCOUNTER — Other Ambulatory Visit: Payer: Self-pay | Admitting: Family Medicine

## 2023-08-08 DIAGNOSIS — J342 Deviated nasal septum: Secondary | ICD-10-CM | POA: Diagnosis not present

## 2023-08-08 DIAGNOSIS — J343 Hypertrophy of nasal turbinates: Secondary | ICD-10-CM | POA: Diagnosis not present

## 2023-08-08 DIAGNOSIS — E785 Hyperlipidemia, unspecified: Secondary | ICD-10-CM

## 2023-08-08 DIAGNOSIS — J34829 Nasal valve collapse, unspecified: Secondary | ICD-10-CM | POA: Diagnosis not present

## 2023-08-08 DIAGNOSIS — G4733 Obstructive sleep apnea (adult) (pediatric): Secondary | ICD-10-CM | POA: Diagnosis not present

## 2023-08-15 DIAGNOSIS — Z9229 Personal history of other drug therapy: Secondary | ICD-10-CM | POA: Diagnosis not present

## 2023-08-15 DIAGNOSIS — I10 Essential (primary) hypertension: Secondary | ICD-10-CM | POA: Diagnosis not present

## 2023-08-15 DIAGNOSIS — Z6841 Body Mass Index (BMI) 40.0 and over, adult: Secondary | ICD-10-CM | POA: Diagnosis not present

## 2023-08-15 DIAGNOSIS — E291 Testicular hypofunction: Secondary | ICD-10-CM | POA: Diagnosis not present

## 2023-09-06 ENCOUNTER — Other Ambulatory Visit: Payer: Self-pay | Admitting: Family Medicine

## 2023-09-06 DIAGNOSIS — E785 Hyperlipidemia, unspecified: Secondary | ICD-10-CM

## 2023-10-03 ENCOUNTER — Ambulatory Visit: Admitting: Urology

## 2023-10-11 ENCOUNTER — Other Ambulatory Visit: Payer: Self-pay | Admitting: Family Medicine

## 2023-10-11 DIAGNOSIS — E785 Hyperlipidemia, unspecified: Secondary | ICD-10-CM

## 2023-11-14 ENCOUNTER — Other Ambulatory Visit: Payer: Self-pay | Admitting: Family Medicine

## 2023-11-14 DIAGNOSIS — E785 Hyperlipidemia, unspecified: Secondary | ICD-10-CM

## 2023-11-25 ENCOUNTER — Ambulatory Visit: Payer: Self-pay

## 2023-11-25 NOTE — Telephone Encounter (Signed)
-----   Message from Norleen Seltzer sent at 11/24/2023  8:52 AM EDT ----- He should just be seen by one of the other providers in 2-3 months.  ----- Message ----- From: Ann Veleria SAUNDERS, CMA Sent: 11/21/2023   8:16 AM EDT To: Norleen Seltzer, MD  Dr. Seltzer, I am follow up on Benjamin Farley's basket while she is out.  This is from a few months ago, I see she had sent pt a mychart message that was never read so I called to follow up with patient.  I  wanted to let you know patient states he had seen his PCP since his last OV with you and his PCP had started him on testosterone  injections.  Patient will continue with this treatment and states he  would like to continue his care here in [place].  Can you let me know how you would like for patient to follow up at this time and I will schedule his appropriately with a [facility] provider.   Thank you.  ----- Message ----- From: Seltzer Norleen, MD Sent: 07/29/2023  12:15 PM EDT To: Exie CHRISTELLA Albee, CMA  His testosterone  level is borderline low but his estradiol  is high suggesting peripheral conversion of the testosterone  to estrogen.     If he would like I can send Arimidex 1mg  to be taken every other day.  #15 with 5 refills.  While in my experience, I have found to it to be well tolerated, Side effects can be some joint and muscle achiness. Headache and skin issues.   It can also cause hot flashes and possibly loss of bone density.  It can potentially cause increased cholesterol as well.   Anxiety and irritability are possible with any treatment to raise testosterone .   Rarely it can have sexual side effects with reduced drive.     Apart from the arimidex, the alternative would be to get on a weight loss program as the excess fat tissue is what produces the enzyme that converts T to E.     If he does decide to try it,  he would need a T panel, Estradiol  and a lipid profile in about 4-6 weeks on therapy and should f/u with me or one of the other providers in  [place]. ----- Message ----- From: Albee Exie CHRISTELLA, CMA Sent: 07/29/2023   9:28 AM EDT To: Norleen Seltzer, MD  Please advise.

## 2023-11-25 NOTE — Telephone Encounter (Signed)
 Patient scheduled with Dr. Nieves for follow up.

## 2023-12-20 ENCOUNTER — Other Ambulatory Visit: Payer: Self-pay | Admitting: Family Medicine

## 2023-12-20 DIAGNOSIS — I10 Essential (primary) hypertension: Secondary | ICD-10-CM

## 2024-02-14 ENCOUNTER — Other Ambulatory Visit: Payer: Self-pay | Admitting: Family Medicine

## 2024-02-14 DIAGNOSIS — I10 Essential (primary) hypertension: Secondary | ICD-10-CM

## 2024-02-14 NOTE — Telephone Encounter (Signed)
 Copied from CRM [id number]. Topic: Clinical - Medication Refill >> Feb 14, 2024  9:23 AM Everette C wrote: Medication: lisinopril -hydrochlorothiazide  (ZESTORETIC ) 10-12.5 MG tablet  Has the patient contacted their pharmacy? Yes (Agent: If no, request that the patient contact the pharmacy for the refill. If patient does not wish to contact the pharmacy document the reason why and proceed with request.) (Agent: If yes, when and what did the pharmacy advise?)  This is the patient's preferred pharmacy:  [facility] [phone] - [place], [place] - [address] AT [facility] [address] [place] [address] Phone: [phone] Fax: [phone]  Is this the correct pharmacy for this prescription? Yes If no, delete pharmacy and type the correct one.   Has the prescription been filled recently? Yes  Is the patient out of the medication? Yes  Has the patient been seen for an appointment in the last year OR does the patient have an upcoming appointment? Yes  Can we respond through MyChart? No  Agent: Please be advised that Rx refills may take up to 3 business days. We ask that you follow-up with your pharmacy.

## 2024-02-15 ENCOUNTER — Other Ambulatory Visit: Payer: Self-pay | Admitting: Family Medicine

## 2024-02-15 DIAGNOSIS — I10 Essential (primary) hypertension: Secondary | ICD-10-CM

## 2024-02-19 ENCOUNTER — Other Ambulatory Visit: Payer: Self-pay | Admitting: Family Medicine

## 2024-02-19 DIAGNOSIS — I10 Essential (primary) hypertension: Secondary | ICD-10-CM

## 2024-02-19 NOTE — Telephone Encounter (Unsigned)
 Copied from CRM [id number]. Topic: Clinical - Medication Refill >> Feb 19, 2024 10:04 AM [name] wrote: Medication: lisinopril -hydrochlorothiazide  (ZESTORETIC ) 10-12.5 MG tablet Patient was a patient of Dr. Bertell who has now retired. Has scheduled visit with Meade for December and requesting refills until able to be seen.   Has the patient contacted their pharmacy? Yes  This is the patient's preferred pharmacy:  [facility] [phone] - [place], [place] - [address] AT [facility] [address] [place] [address] Phone: [phone] Fax: [phone]  Is this the correct pharmacy for this prescription? Yes  Has the prescription been filled recently? No  Is the patient out of the medication? Yes  Has the patient been seen for an appointment in the last year OR does the patient have an upcoming appointment? Yes  Can we respond through MyChart? No  Agent: Please be advised that Rx refills may take up to 3 business days. We ask that you follow-up with your pharmacy.

## 2024-02-27 ENCOUNTER — Telehealth: Payer: Self-pay

## 2024-02-27 ENCOUNTER — Ambulatory Visit: Payer: Self-pay

## 2024-02-27 NOTE — Telephone Encounter (Signed)
 Copied from CRM #[id number]. Topic: Clinical - Prescription Issue >> Feb 27, 2024  1:02 PM Winona R wrote: Pt wife calling as his lisinopril -hydrochlorothiazide  (ZESTORETIC ) 10-12.5 MG tablet refill is being denied and he's experiencing headaches and high blood pressure without it. Pt would like to know why its being denied

## 2024-02-27 NOTE — Telephone Encounter (Signed)
 FYI Only or Action Required?: Action required by provider: clinical question for provider.  Patient was last seen in primary care on 04/13/2022 by Edman Meade PEDLAR, FNP.  Called Nurse Triage reporting Hypertension (With eating).  Symptoms began several weeks ago.  Interventions attempted: Rest, hydration, or home remedies.  Symptoms are: unchanged.  Triage Disposition: Call PCP Within 24 Hours  Patient/caregiver understands and will follow disposition?: Yes    Pt states his blood pressure spikes when ever he eats. Max was about 162/100 pt waiting on  lisinopril -hydrochlorothiazide  (ZESTORETIC ) 10-12.5 MG tablet to be refill.  Reason for Disposition  Ran out of BP medications  Answer Assessment - Initial Assessment Questions Patient reports elevated blood pressure readings while eating. Endorses sweating and headaches during eating. Patient is currently on a 24 hour fast and states blood pressure is normal. Requesting a call back from office for clarification on his medication refill. Did give the option of Urgent care to refill his medication but patient would like to speak to office.   1. BLOOD PRESSURE: What is your blood pressure? Did you take at least two measurements 5 minutes apart?     128/86 currently-currently he is fasting. Patient reports blood pressure spikes while he is eating.  2. ONSET: When did you take your blood pressure?     In the AM 3. HOW: How did you take your blood pressure? (e.g., automatic home BP monitor, visiting nurse)     Automatic home BP monitor 4. HISTORY: Do you have a history of high blood pressure?     yes 5. MEDICINES: Are you taking any medicines for blood pressure? Have you missed any doses recently?     Yes but patient has been out of blood pressure medication for 2 months.  6. OTHER SYMPTOMS: Do you have any symptoms? (e.g., blurred vision, chest pain, difficulty breathing, headache, weakness)     No symptoms currently.  Reports headaches and sweating while eating.  Protocols used: Blood Pressure - High-A-AH

## 2024-03-16 ENCOUNTER — Ambulatory Visit: Admitting: Urology

## 2024-04-03 ENCOUNTER — Ambulatory Visit: Payer: Self-pay | Admitting: Family Medicine

## 2024-04-03 VITALS — BP 155/91 | HR 83 | Resp 16 | Ht 73.0 in | Wt 373.1 lb

## 2024-04-03 DIAGNOSIS — E782 Mixed hyperlipidemia: Secondary | ICD-10-CM | POA: Diagnosis not present

## 2024-04-03 DIAGNOSIS — E038 Other specified hypothyroidism: Secondary | ICD-10-CM

## 2024-04-03 DIAGNOSIS — E559 Vitamin D deficiency, unspecified: Secondary | ICD-10-CM

## 2024-04-03 DIAGNOSIS — R7301 Impaired fasting glucose: Secondary | ICD-10-CM | POA: Diagnosis not present

## 2024-04-03 DIAGNOSIS — I10 Essential (primary) hypertension: Secondary | ICD-10-CM

## 2024-04-03 MED ORDER — VITAMIN D (ERGOCALCIFEROL) 1.25 MG (50000 UNIT) PO CAPS: 50000.0000 [IU] | ORAL_CAPSULE | ORAL | 1 refills | Status: DC

## 2024-04-03 MED ORDER — LISINOPRIL-HYDROCHLOROTHIAZIDE 10-12.5 MG PO TABS: 1.0000 | ORAL_TABLET | Freq: Every day | ORAL | 3 refills | Status: AC

## 2024-04-03 NOTE — Patient Instructions (Signed)
 I appreciate the opportunity to provide care to you today!    Follow up:  5 months  Labs: please stop by the lab today to get your blood drawn (CBC, CMP, TSH, Lipid profile, HgA1c, Vit D)  Hypertension Management  Your current blood pressure is above the target goal of <140/90 mmHg. To address this, please continue your current regimen.    Medication Instructions: Take your blood pressure medication at the same time each day. After taking your medication, check your blood pressure at least an hour later. If your first reading is >140/90 mmHg, wait at least 10 minutes and recheck your blood pressure. Side Effects: In the initial days of therapy, you may experience dizziness or lightheadedness as your body adjusts to the lower blood pressure; this is expected. Diet and Lifestyle: Adhere to a low-sodium diet, limiting intake to less than 1500 mg daily, and increase your physical activity. Avoid over-the-counter NSAIDs such as ibuprofen  and naproxen while on this medication. Hydration and Nutrition: Stay well-hydrated by drinking at least 64 ounces of water daily. Increase your servings of fruits and vegetables and avoid excessive sodium in your diet. Long-Term Considerations: Uncontrolled hypertension can increase the risk of cardiovascular diseases, including stroke, coronary artery disease, and heart failure.  Please report to the emergency department if your blood pressure exceeds 180/120 and is accompanied by symptoms such as headaches, chest pain, palpitations, blurred vision, or dizziness.    Attached with your AVS, you will find valuable resources for self-education. I highly recommend dedicating some time to thoroughly examine them.   Please continue to a heart-healthy diet and increase your physical activities. Try to exercise for at least five days a week.    It was a pleasure to see you and I look forward to continuing to work together on your health and well-being. Please  do not hesitate to call the office if you need care or have questions about your care.  In case of emergency, please visit the Emergency Department for urgent care, or contact our clinic at [phone] to schedule an appointment. We're here to help you!   Have a wonderful day and week. With Gratitude, Meade JENEANE Gerlach MSN, FNP-BC, PMHNP-BC

## 2024-04-03 NOTE — Assessment & Plan Note (Signed)
 Uncontrolled blood pressure noted in the clinic today.  The patient reports being out of his blood pressure medication for approximately three months. Refills will be sent to the pharmacy today. Encouraged strict medication adherence and compliance. Reinforced lifestyle modifications, including a low-sodium diet and increased physical activity as tolerated.

## 2024-04-03 NOTE — Progress Notes (Signed)
 Established Patient Office Visit  Subjective:  Patient ID: Benjamin Farley, male    DOB: [birth year]  Age: 43 y.o. MRN: [id number]  CC:  Chief Complaint  Patient presents with   Hypertension    Follow up visit. Has been out of the lisinopril  for 2-3 months due to denied refills.     HPI RECE Benjamin Farley is a 43 y.o. male with past medical history of hypertension, hyperlipidemia, and vitamin D  deficiency presents for f/u of  chronic medical conditions. For the details of today's visit, please refer to the assessment and plan.     Past Medical History:  Diagnosis Date   HTN (hypertension)     Past Surgical History:  Procedure Laterality Date   CHOLECYSTECTOMY     INCISIONAL HERNIA REPAIR N/A 06/16/2021   Procedure: HERNIA REPAIR INCISIONAL W/ MESH;  Surgeon: Mavis Anes, MD;  Location: [facility];  Service: General;  Laterality: N/A;    Family History  Problem Relation Age of Onset   COPD Father    Hypertension Father    CVA Maternal Grandmother    Hypertension Maternal Grandmother    COPD Maternal Grandfather    COPD Paternal Grandmother    Heart attack Paternal Grandfather    Hypertension Paternal Grandfather     Social History   Socioeconomic History   Marital status: Married    Spouse name: Not on file   Number of children: Not on file   Years of education: Not on file   Highest education level: Some college, no degree  Occupational History   Not on file  Tobacco Use   Smoking status: Never   Smokeless tobacco: Never  Vaping Use   Vaping status: Never Used  Substance and Sexual Activity   Alcohol use: Yes    Comment: occ   Drug use: No   Sexual activity: Not on file  Other Topics Concern   Not on file  Social History Narrative   Not on file   Social Drivers of Health   Financial Resource Strain: Low Risk  (04/03/2024)   Overall Financial Resource Strain (CARDIA)    Difficulty of Paying Living Expenses: Not hard at all  Food Insecurity: No Food  Insecurity (04/03/2024)   Hunger Vital Sign    Worried About Running Out of Food in the Last Year: Never true    Ran Out of Food in the Last Year: Never true  Transportation Needs: No Transportation Needs (04/03/2024)   PRAPARE - Administrator, Civil Service (Medical): No    Lack of Transportation (Non-Medical): No  Physical Activity: Sufficiently Active (04/03/2024)   Exercise Vital Sign    Days of Exercise per Week: 4 days    Minutes of Exercise per Session: 90 min  Stress: No Stress Concern Present (04/03/2024)   [employer] of Occupational Health - Occupational Stress Questionnaire    Feeling of Stress: Not at all  Social Connections: Moderately Integrated (04/03/2024)   Social Connection and Isolation Panel    Frequency of Communication with Friends and Family: Twice a week    Frequency of Social Gatherings with Friends and Family: Once a week    Attends Religious Services: More than 4 times per year    Active Member of [employer] or Organizations: No    Attends Engineer, Structural: Not on file    Marital Status: Married  Catering Manager Violence: Not on file    Outpatient Medications Prior to Visit  Medication Sig Dispense  Refill   rosuvastatin  (CRESTOR ) 10 MG tablet TAKE 1 TABLET(10 MG) BY MOUTH EVERY EVENING 30 tablet 0   sildenafil  (VIAGRA ) 100 MG tablet Take 1 tablet (100 mg total) by mouth daily as needed for erectile dysfunction. Take 1/2 to 1 tab po with the daily tadalafil . 6 tablet 11   tadalafil  (CIALIS ) 5 MG tablet Take 1 tablet (5 mg total) by mouth daily as needed for erectile dysfunction. 30 tablet 11   Vitamin D , Ergocalciferol , (DRISDOL ) 1.25 MG (50000 UNIT) CAPS capsule TAKE 1 CAPSULE BY MOUTH EVERY 7 DAYS. 20 capsule 1   cyclobenzaprine  (FLEXERIL ) 5 MG tablet TAKE 1 TABLET(5 MG) BY MOUTH THREE TIMES DAILY AS NEEDED FOR MUSCLE SPASMS 30 tablet 1   lisinopril -hydrochlorothiazide  (ZESTORETIC ) 10-12.5 MG tablet TAKE 1 TABLET BY MOUTH DAILY  (Patient not taking: Reported on 04/03/2024) 90 tablet 3   No facility-administered medications prior to visit.    No Known Allergies  ROS Review of Systems  Constitutional:  Negative for fatigue and fever.  Eyes:  Negative for visual disturbance.  Respiratory:  Negative for chest tightness and shortness of breath.   Cardiovascular:  Negative for chest pain and palpitations.  Neurological:  Negative for dizziness and headaches.      Objective:    Physical Exam HENT:     Head: Normocephalic.     Right Ear: External ear normal.     Left Ear: External ear normal.     Nose: No congestion or rhinorrhea.     Mouth/Throat:     Mouth: Mucous membranes are moist.  Cardiovascular:     Rate and Rhythm: Regular rhythm.     Heart sounds: No murmur heard. Pulmonary:     Effort: No respiratory distress.     Breath sounds: Normal breath sounds.  Neurological:     Mental Status: He is alert.     BP (!) 155/91   Pulse 83   Resp 16   Ht 6' 1 (1.854 m)   Wt (!) 373 lb 1.9 oz (169.2 kg)   SpO2 97%   BMI 49.23 kg/m  Wt Readings from Last 3 Encounters:  04/03/24 (!) 373 lb 1.9 oz (169.2 kg)  04/13/22 (!) 371 lb 1.3 oz (168.3 kg)  12/21/21 (!) 364 lb 0.6 oz (165.1 kg)    Lab Results  Component Value Date   TSH 2.940 12/21/2021   Lab Results  Component Value Date   WBC 9.3 12/21/2021   HGB 14.7 12/21/2021   HCT 43.9 12/21/2021   MCV 88 12/21/2021   PLT 297 12/21/2021   Lab Results  Component Value Date   NA 139 12/21/2021   K 4.6 12/21/2021   CO2 20 12/21/2021   GLUCOSE 93 12/21/2021   BUN 22 12/21/2021   CREATININE 0.98 12/21/2021   BILITOT 0.3 12/21/2021   ALKPHOS 114 12/21/2021   AST 20 12/21/2021   ALT 28 12/21/2021   PROT 7.3 12/21/2021   ALBUMIN 4.2 07/26/2023   CALCIUM  9.7 12/21/2021   ANIONGAP 12 06/14/2021   EGFR 99 12/21/2021   Lab Results  Component Value Date   CHOL 146 12/21/2021   Lab Results  Component Value Date   HDL 44 12/21/2021    Lab Results  Component Value Date   LDLCALC 85 12/21/2021   Lab Results  Component Value Date   TRIG 89 12/21/2021   Lab Results  Component Value Date   CHOLHDL 3.3 12/21/2021   Lab Results  Component Value Date   HGBA1C  5.8 (H) 12/21/2021      Assessment & Plan:  Essential hypertension Assessment & Plan: Uncontrolled blood pressure noted in the clinic today.  The patient reports being out of his blood pressure medication for approximately three months. Refills will be sent to the pharmacy today. Encouraged strict medication adherence and compliance. Reinforced lifestyle modifications, including a low-sodium diet and increased physical activity as tolerated.   Orders: -     Lisinopril -hydroCHLOROthiazide ; Take 1 tablet by mouth daily.  Dispense: 90 tablet; Refill: 3  Vitamin D  deficiency Assessment & Plan: Encouraged to increase his intake of vitamin D -rich foods such as fatty fish (e.g., salmon, mackerel, and sardines), fortified dairy products, egg yolks, and fortified cereals.   Orders: -     Vitamin D  (Ergocalciferol ); Take 1 capsule (50,000 Units total) by mouth every 7 (seven) days.  Dispense: 27 capsule; Refill: 1 -     VITAMIN D  25 Hydroxy (Vit-D Deficiency, Fractures)  Mixed hyperlipidemia -     Lipid panel -     CMP14+EGFR -     CBC with Differential/Platelet  IFG (impaired fasting glucose) -     Hemoglobin A1c  TSH (thyroid -stimulating hormone deficiency) -     TSH + free T4  Note: This chart has been completed using Engineer, Civil (consulting) software, and while attempts have been made to ensure accuracy, certain words and phrases may not be transcribed as intended.    Follow-up: Return in about 5 months (around 09/01/2024).   Dmetrius Ambs  Z Bacchus, FNP

## 2024-04-03 NOTE — Assessment & Plan Note (Signed)
 Encouraged to increase his intake of vitamin D-rich foods such as fatty fish (e.g., salmon, mackerel, and sardines), fortified dairy products, egg yolks, and fortified cereals.

## 2024-04-04 ENCOUNTER — Other Ambulatory Visit: Payer: Self-pay | Admitting: Family Medicine

## 2024-04-04 DIAGNOSIS — E559 Vitamin D deficiency, unspecified: Secondary | ICD-10-CM

## 2024-04-04 LAB — CMP14+EGFR
ALT: 23 IU/L (ref 0–44)
AST: 19 IU/L (ref 0–40)
Albumin: 4.3 g/dL (ref 4.1–5.1)
Alkaline Phosphatase: 100 IU/L (ref 47–123)
BUN/Creatinine Ratio: 16 (ref 9–20)
BUN: 15 mg/dL (ref 6–24)
Bilirubin Total: 0.4 mg/dL (ref 0.0–1.2)
CO2: 22 mmol/L (ref 20–29)
Calcium: 9.1 mg/dL (ref 8.7–10.2)
Chloride: 103 mmol/L (ref 96–106)
Creatinine, Ser: 0.93 mg/dL (ref 0.76–1.27)
Globulin, Total: 2.5 g/dL (ref 1.5–4.5)
Glucose: 93 mg/dL (ref 70–99)
Potassium: 4.6 mmol/L (ref 3.5–5.2)
Sodium: 138 mmol/L (ref 134–144)
Total Protein: 6.8 g/dL (ref 6.0–8.5)
eGFR: 103 mL/min/1.73 (ref >59)

## 2024-04-04 LAB — CBC WITH DIFFERENTIAL/PLATELET
Basophils Absolute: 0 x10E3/uL (ref 0.0–0.2)
Basos: 0 %
EOS (ABSOLUTE): 0.1 x10E3/uL (ref 0.0–0.4)
Eos: 2 %
Hematocrit: 45.3 % (ref 37.5–51.0)
Hemoglobin: 14.4 g/dL (ref 13.0–17.7)
Immature Grans (Abs): 0 x10E3/uL (ref 0.0–0.1)
Immature Granulocytes: 0 %
Lymphocytes Absolute: 1.7 x10E3/uL (ref 0.7–3.1)
Lymphs: 21 %
MCH: 28.2 pg (ref 26.6–33.0)
MCHC: 31.8 g/dL (ref 31.5–35.7)
MCV: 89 fL (ref 79–97)
Monocytes Absolute: 0.5 x10E3/uL (ref 0.1–0.9)
Monocytes: 6 %
Neutrophils Absolute: 5.8 x10E3/uL (ref 1.4–7.0)
Neutrophils: 71 %
Platelets: 304 x10E3/uL (ref 150–450)
RBC: 5.11 x10E6/uL (ref 4.14–5.80)
RDW: 12.6 % (ref 11.6–15.4)
WBC: 8.1 x10E3/uL (ref 3.4–10.8)

## 2024-04-04 LAB — LIPID PANEL
Chol/HDL Ratio: 3.2 ratio (ref 0.0–5.0)
Cholesterol, Total: 125 mg/dL (ref 100–199)
HDL: 39 mg/dL — ABNORMAL LOW (ref >39)
LDL Chol Calc (NIH): 68 mg/dL (ref 0–99)
Triglycerides: 92 mg/dL (ref 0–149)
VLDL Cholesterol Cal: 18 mg/dL (ref 5–40)

## 2024-04-04 LAB — TSH+FREE T4
Free T4: 1.1 ng/dL (ref 0.82–1.77)
TSH: 1.83 u[IU]/mL (ref 0.450–4.500)

## 2024-04-04 LAB — HEMOGLOBIN A1C
Est. average glucose Bld gHb Est-mCnc: 114 mg/dL
Hgb A1c MFr Bld: 5.6 % (ref 4.8–5.6)

## 2024-04-04 LAB — VITAMIN D 25 HYDROXY (VIT D DEFICIENCY, FRACTURES): Vit D, 25-Hydroxy: 41.3 ng/mL (ref 30.0–100.0)

## 2024-04-05 ENCOUNTER — Ambulatory Visit: Payer: Self-pay | Admitting: Family Medicine

## 2024-04-05 NOTE — Progress Notes (Signed)
Please inform the patient that his labs are stable

## 2024-04-23 ENCOUNTER — Other Ambulatory Visit: Payer: Self-pay | Admitting: Family Medicine

## 2024-04-23 DIAGNOSIS — E785 Hyperlipidemia, unspecified: Secondary | ICD-10-CM

## 2024-05-28 ENCOUNTER — Other Ambulatory Visit: Payer: Self-pay | Admitting: Family Medicine

## 2024-05-28 DIAGNOSIS — E785 Hyperlipidemia, unspecified: Secondary | ICD-10-CM

## 2024-06-02 ENCOUNTER — Ambulatory Visit: Admitting: Family Medicine

## 2024-06-02 ENCOUNTER — Encounter: Payer: Self-pay | Admitting: Family Medicine

## 2024-06-02 VITALS — BP 120/82 | HR 90 | Resp 16 | Ht 74.0 in | Wt 374.0 lb

## 2024-06-02 DIAGNOSIS — Z6841 Body Mass Index (BMI) 40.0 and over, adult: Secondary | ICD-10-CM | POA: Diagnosis not present

## 2024-06-02 DIAGNOSIS — J209 Acute bronchitis, unspecified: Secondary | ICD-10-CM | POA: Insufficient documentation

## 2024-06-02 DIAGNOSIS — I10 Essential (primary) hypertension: Secondary | ICD-10-CM

## 2024-06-02 DIAGNOSIS — Z1211 Encounter for screening for malignant neoplasm of colon: Secondary | ICD-10-CM | POA: Insufficient documentation

## 2024-06-02 MED ORDER — AZITHROMYCIN 250 MG PO TABS: ORAL_TABLET | ORAL | 0 refills | Status: AC

## 2024-06-02 MED ORDER — PREDNISONE 5 MG PO TABS: 5.0000 mg | ORAL_TABLET | Freq: Two times a day (BID) | ORAL | 0 refills | Status: AC

## 2024-06-02 MED ORDER — PROMETHAZINE-DM 6.25-15 MG/5ML PO SYRP: ORAL_SOLUTION | ORAL | 0 refills | Status: AC

## 2024-06-02 MED ORDER — BENZONATATE 100 MG PO CAPS: 100.0000 mg | ORAL_CAPSULE | Freq: Two times a day (BID) | ORAL | 0 refills | Status: AC | PRN

## 2024-06-02 NOTE — Patient Instructions (Addendum)
" °  F/U in 2 to 3 months with PCP  Work excuse start Jan 28 and return Feb 8  You are treated for acute  bronchitis  Fluids, rest and take medication as pprescribed ( 4) Z pack ( antibiotic) Prednisone  x 5 days  Tessalon  Perles Phnergan dM  Colonoscopy recommended for colon cancer screening please let Nurse know if you want to be referred, spoke ith pt and he is agreeable to procedure , referral entered and he is aware    "

## 2024-06-02 NOTE — Assessment & Plan Note (Signed)
 Referred for screening colonoscopy based on age No known f/h of colon cancer and asymptomatic

## 2024-06-02 NOTE — Assessment & Plan Note (Signed)
 Tessalon  perles, prednisone  and Z pack prescribed and work excuse fron 01/28 to return Feb 8

## 2024-06-05 ENCOUNTER — Telehealth: Payer: Self-pay | Admitting: Family Medicine

## 2024-06-05 NOTE — Telephone Encounter (Signed)
 Disability forms Copied Noted Originally placed provider box (Dr Antonetta) last seen the patient  Copy placed front desk folder

## 2024-06-05 NOTE — Telephone Encounter (Signed)
 Obtained

## 2024-10-01 ENCOUNTER — Ambulatory Visit: Payer: Self-pay
